# Patient Record
Sex: Female | Born: 1985 | Race: Black or African American | Hispanic: No | Marital: Single | Smoking: Current every day smoker
Health system: Southern US, Community
[De-identification: ages and names within clinical notes are randomized; demographics above are authoritative.]

## PROBLEM LIST (undated history)

## (undated) ENCOUNTER — Inpatient Hospital Stay (HOSPITAL_COMMUNITY): Payer: Self-pay

## (undated) DIAGNOSIS — F32A Depression, unspecified: Secondary | ICD-10-CM

## (undated) DIAGNOSIS — F329 Major depressive disorder, single episode, unspecified: Secondary | ICD-10-CM

## (undated) HISTORY — PX: TONSILLECTOMY: SUR1361

---
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
## 2008-01-17 ENCOUNTER — Emergency Department (HOSPITAL_COMMUNITY): Admission: EM | Admit: 2008-01-17 | Discharge: 2008-01-17 | Payer: Self-pay | Admitting: Emergency Medicine

## 2008-04-18 ENCOUNTER — Emergency Department (HOSPITAL_COMMUNITY): Admission: EM | Admit: 2008-04-18 | Discharge: 2008-04-18 | Payer: Self-pay | Admitting: Emergency Medicine

## 2010-02-16 ENCOUNTER — Inpatient Hospital Stay (HOSPITAL_COMMUNITY): Admission: AD | Admit: 2010-02-16 | Discharge: 2010-02-19 | Payer: Self-pay | Admitting: Obstetrics & Gynecology

## 2010-02-16 ENCOUNTER — Encounter (INDEPENDENT_AMBULATORY_CARE_PROVIDER_SITE_OTHER): Payer: Self-pay | Admitting: Obstetrics and Gynecology

## 2010-09-13 LAB — CBC
HCT: 33.6 % — ABNORMAL LOW (ref 36.0–46.0)
Hemoglobin: 11.2 g/dL — ABNORMAL LOW (ref 12.0–15.0)
Hemoglobin: 9 g/dL — ABNORMAL LOW (ref 12.0–15.0)
MCH: 28.8 pg (ref 26.0–34.0)
MCHC: 33.3 g/dL (ref 30.0–36.0)
MCV: 86.4 fL (ref 78.0–100.0)
Platelets: 178 10*3/uL (ref 150–400)
Platelets: 215 10*3/uL (ref 150–400)
RBC: 3.04 MIL/uL — ABNORMAL LOW (ref 3.87–5.11)
RBC: 3.89 MIL/uL (ref 3.87–5.11)
RDW: 16 % — ABNORMAL HIGH (ref 11.5–15.5)
WBC: 11 10*3/uL — ABNORMAL HIGH (ref 4.0–10.5)
WBC: 5.3 10*3/uL (ref 4.0–10.5)

## 2010-09-13 LAB — RPR: RPR Ser Ql: NONREACTIVE

## 2010-11-15 NOTE — H&P (Signed)
NAMEKENLYN, LOSE                ACCOUNT NO.:  [id number]   MEDICAL RECORD NO.:  [id number]          PATIENT TYPE:  INP   LOCATION:  0102                         FACILITY:  [facility]   PHYSICIAN:  Corinna L. Lendell Caprice, MDDATE OF BIRTH:  1985-10-10   DATE OF ADMISSION:  04/05/2005  DATE OF DISCHARGE:                                HISTORY & PHYSICAL   CHIEF COMPLAINT:  Tylenol overdose.   HISTORY OF PRESENT ILLNESS:  Ms. Yepiz is an 25 year old black female  patient of Dr. Theresia Lo who was brought to the emergency room apparently by  her mother after she admitted to taking 15 Extra Strength Tylenol at  approximately 3 a.m. She reports that she was trying to kill herself and  that she still feels suicidal. She has been feeling depressed for quite some  time. There is no particular event that has occurred to lead to this  attempt. However, she has been very stressed out, working two jobs, recently  started school at [employer]. She has never attempted suicide nor had a  psychiatrist, therapist, or psychiatric admission. She has never been  treated for depression in the past.  She has no pain. No nausea or vomiting  currently.   PAST MEDICAL HISTORY:  None.   PAST SURGICAL HISTORY:  None.   ALLERGIES:  None.   SOCIAL HISTORY:  She denies drinking, smoking, or drugs.   FAMILY HISTORY:  Negative for depression.   REVIEW OF SYSTEMS:  As above. Otherwise negative.   Her CBC is significant for a hemoglobin of 11.8, hematocrit 36.3, MCV 79.5;  otherwise unremarkable. PT and PTT normal. Complete metabolic panel normal.  Urine pregnancy negative. Salicylate less than 4. Acetaminophen level 83.5.  This was drawn at 10 a.m. Blood alcohol less than 5.  Urine drug screen  negative.   ASSESSMENT/PLAN:  Status post suicide attempt with Tylenol overdose. Poison  Control was contacted by the emergency department staff. They recommended  Mucomyst for at least 24 hours. However her repeat  laboratories show no  significant change. This can be stopped. I will admit her to telemetry on  suicide precautions. IV  acetylcysteine has been started per pharmacy protocol. I will continue this  and repeat her liver function tests and coagulation panel tomorrow. She will  be on 1 to 1 suicide precautions. I have called a consult over to behavioral  health. I anticipate that she will most likely be medically ready for  discharge tomorrow after 24 hours of Mucomyst.      Corinna L. Lendell Caprice, MD  Electronically Signed     CLS/MEDQ  D:  04/05/2005  T:  04/05/2005  Job:  [id number]   cc:   Vikki Ports, M.D.  Fax: [phone]

## 2011-04-06 ENCOUNTER — Encounter (HOSPITAL_COMMUNITY): Payer: Self-pay

## 2011-04-06 ENCOUNTER — Inpatient Hospital Stay (HOSPITAL_COMMUNITY)
Admission: AD | Admit: 2011-04-06 | Discharge: 2011-04-06 | Disposition: A | Discharge status: Home / Self Care | Payer: Medicaid Other | Source: Ambulatory Visit | Attending: Obstetrics and Gynecology | Admitting: Obstetrics and Gynecology

## 2011-04-06 ENCOUNTER — Inpatient Hospital Stay (HOSPITAL_COMMUNITY): Payer: Medicaid Other

## 2011-04-06 DIAGNOSIS — R1011 Right upper quadrant pain: Secondary | ICD-10-CM

## 2011-04-06 DIAGNOSIS — Z3480 Encounter for supervision of other normal pregnancy, unspecified trimester: Secondary | ICD-10-CM

## 2011-04-06 DIAGNOSIS — N949 Unspecified condition associated with female genital organs and menstrual cycle: Secondary | ICD-10-CM

## 2011-04-06 DIAGNOSIS — O99891 Other specified diseases and conditions complicating pregnancy: Secondary | ICD-10-CM | POA: Insufficient documentation

## 2011-04-06 DIAGNOSIS — R109 Unspecified abdominal pain: Secondary | ICD-10-CM | POA: Insufficient documentation

## 2011-04-06 DIAGNOSIS — O26859 Spotting complicating pregnancy, unspecified trimester: Secondary | ICD-10-CM | POA: Insufficient documentation

## 2011-04-06 LAB — DIFFERENTIAL
Eosinophils Relative: 3 % (ref 0–5)
Lymphocytes Relative: 27 % (ref 12–46)
Lymphs Abs: 1.5 10*3/uL (ref 0.7–4.0)
Monocytes Relative: 8 % (ref 3–12)
Neutrophils Relative %: 63 % (ref 43–77)

## 2011-04-06 LAB — CBC
Hemoglobin: 10.1 g/dL — ABNORMAL LOW (ref 12.0–15.0)
MCV: 84.4 fL (ref 78.0–100.0)
Platelets: 232 10*3/uL (ref 150–400)
RBC: 3.66 MIL/uL — ABNORMAL LOW (ref 3.87–5.11)
WBC: 5.7 10*3/uL (ref 4.0–10.5)

## 2011-04-06 LAB — HEPATITIS B SURFACE ANTIGEN: Hepatitis B Surface Ag: NEGATIVE

## 2011-04-06 LAB — URINALYSIS, ROUTINE W REFLEX MICROSCOPIC
Ketones, ur: NEGATIVE mg/dL
Leukocytes, UA: NEGATIVE
Nitrite: NEGATIVE
Specific Gravity, Urine: 1.025 (ref 1.005–1.030)
pH: 6.5 (ref 5.0–8.0)

## 2011-04-06 LAB — ABO/RH: ABO/RH(D): A POS

## 2011-04-06 LAB — RAPID URINE DRUG SCREEN, HOSP PERFORMED
Benzodiazepines: NOT DETECTED
Cocaine: NOT DETECTED
Opiates: NOT DETECTED

## 2011-04-06 LAB — HIV ANTIBODY (ROUTINE TESTING W REFLEX): HIV: NONREACTIVE

## 2011-04-06 NOTE — ED Provider Notes (Signed)
History   pt in with c/o r sided upper abd pain that is intermittent and sharp in nature that has been going on for several days.   Chief Complaint  Patient presents with  . Vaginal Bleeding  . Abdominal Cramping   HPI  OB History    Grav Para Term Preterm Abortions TAB SAB Ect Mult Living   2 1 1  0 0 0 0 0 0 1      Past Medical History  Diagnosis Date  . No pertinent past medical history     Past Surgical History  Procedure Date  . No past surgeries     No family history on file.  History  Substance Use Topics  . Smoking status: Former Smoker    Quit date: 12/29/2010  . Smokeless tobacco: Not on file  . Alcohol Use: No    Allergies: Allergies not on file  No prescriptions prior to admission    Review of Systems  Constitutional: Negative.   Eyes: Negative.   Respiratory: Negative.   Cardiovascular: Negative.   Gastrointestinal: Positive for abdominal pain.  Genitourinary: Negative.   Musculoskeletal: Negative.   Skin: Negative.   Neurological: Negative.   Endo/Heme/Allergies: Negative.   Psychiatric/Behavioral: Negative.    Physical Exam   Blood pressure 120/70, pulse 87, temperature 98.9 F (37.2 C), temperature source Oral, resp. rate 18, height 5\' 4"  (1.626 m), weight 81.103 kg (178 lb 12.8 oz).  Physical Exam  Constitutional: She is oriented to person, place, and time. She appears well-developed and well-nourished.  HENT:  Head: Normocephalic.  Neck: Normal range of motion.  Cardiovascular: Normal rate and regular rhythm.   Respiratory: Effort normal and breath sounds normal.  GI: Soft. Bowel sounds are normal.  Genitourinary: Vagina normal and uterus normal.  Musculoskeletal: Normal range of motion.  Neurological: She is alert and oriented to person, place, and time. She has normal reflexes.  Skin: Skin is warm and dry.  Psychiatric: She has a normal mood and affect. Her behavior is normal. Judgment and thought content normal.    MAU  Course  Procedures  MDM   Assessment and Plan  pn labs, [place] for dates and cervical lenghth. SVE cx cl/th/post high no bleeding noted on glove after exam. Stable maternal fetal unit.  Zerita Boers 04/06/2011, 10:49 AM

## 2011-04-06 NOTE — Progress Notes (Signed)
Onset of spotting since around 2:00 a.m. Stabbing type pains on sides, no spotting at present, no intercourse recently.

## 2011-04-24 LAB — RUBELLA ANTIBODY, IGM: Rubella: IMMUNE

## 2011-04-24 LAB — HIV ANTIBODY (ROUTINE TESTING W REFLEX): HIV: NONREACTIVE

## 2011-07-01 NOTE — L&D Delivery Note (Signed)
Delivery Note At 2:47 PM a viable female was delivered via VBAC, Spontaneous (Presentation: ; L Occiput Anterior). Easy delivery of the head, tight nuchal cord x 1 clamped and cut, easy delivery of the shoulders. Baby taken to RN at warmer. APGAR: 8, 9; weight .   Placenta status: Intact, Spontaneous.  Cord: 3 vessels. Anesthesia: Epidural  Episiotomy: None Lacerations: None Suture Repair: none Est. Blood Loss (mL): 300  Mom to postpartum.  Baby to rooming in.  Sherri Williams 08/15/2011, 3:05 PM

## 2011-07-25 LAB — GC/CHLAMYDIA PROBE AMP, GENITAL
Chlamydia: NEGATIVE
Gonorrhea: NEGATIVE

## 2011-08-15 ENCOUNTER — Encounter (HOSPITAL_COMMUNITY): Payer: Self-pay | Admitting: Anesthesiology

## 2011-08-15 ENCOUNTER — Inpatient Hospital Stay (HOSPITAL_COMMUNITY): Payer: Medicaid Other | Admitting: Anesthesiology

## 2011-08-15 ENCOUNTER — Inpatient Hospital Stay (HOSPITAL_COMMUNITY)
Admission: AD | Admit: 2011-08-15 | Discharge: 2011-08-17 | DRG: 775 | Disposition: A | Discharge status: Home / Self Care | Payer: Medicaid Other | Source: Ambulatory Visit | Attending: Obstetrics and Gynecology | Admitting: Obstetrics and Gynecology

## 2011-08-15 ENCOUNTER — Encounter (HOSPITAL_COMMUNITY): Payer: Self-pay

## 2011-08-15 DIAGNOSIS — IMO0001 Reserved for inherently not codable concepts without codable children: Secondary | ICD-10-CM

## 2011-08-15 DIAGNOSIS — O165 Unspecified maternal hypertension, complicating the puerperium: Secondary | ICD-10-CM | POA: Diagnosis not present

## 2011-08-15 DIAGNOSIS — D649 Anemia, unspecified: Secondary | ICD-10-CM | POA: Diagnosis not present

## 2011-08-15 DIAGNOSIS — Z98891 History of uterine scar from previous surgery: Secondary | ICD-10-CM

## 2011-08-15 DIAGNOSIS — R7989 Other specified abnormal findings of blood chemistry: Secondary | ICD-10-CM | POA: Clinically undetermined

## 2011-08-15 DIAGNOSIS — O34219 Maternal care for unspecified type scar from previous cesarean delivery: Secondary | ICD-10-CM | POA: Diagnosis present

## 2011-08-15 DIAGNOSIS — O9903 Anemia complicating the puerperium: Secondary | ICD-10-CM | POA: Diagnosis not present

## 2011-08-15 DIAGNOSIS — F329 Major depressive disorder, single episode, unspecified: Secondary | ICD-10-CM | POA: Diagnosis not present

## 2011-08-15 HISTORY — DX: Other specified abnormal findings of blood chemistry: R79.89

## 2011-08-15 HISTORY — DX: Unspecified maternal hypertension, complicating the puerperium: O16.5

## 2011-08-15 HISTORY — DX: Major depressive disorder, single episode, unspecified: F32.9

## 2011-08-15 HISTORY — DX: Depression, unspecified: F32.A

## 2011-08-15 LAB — CBC
HCT: 31.9 % — ABNORMAL LOW (ref 36.0–46.0)
MCHC: 32.3 g/dL (ref 30.0–36.0)
MCHC: 32.4 g/dL (ref 30.0–36.0)
MCV: 82 fL (ref 78.0–100.0)
Platelets: 169 10*3/uL (ref 150–400)
RDW: 15.8 % — ABNORMAL HIGH (ref 11.5–15.5)
RDW: 15.7 % — ABNORMAL HIGH (ref 11.5–15.5)
WBC: 11.4 10*3/uL — ABNORMAL HIGH (ref 4.0–10.5)

## 2011-08-15 MED ORDER — PRENATAL MULTIVITAMIN CH
1.0000 | ORAL_TABLET | Freq: Every day | ORAL | Status: DC
  Administered 2011-08-15 – 2011-08-17 (×3): 1 via ORAL
  Filled 2011-08-15 (×3): qty 1

## 2011-08-15 MED ORDER — OXYCODONE-ACETAMINOPHEN 5-325 MG PO TABS: 1.0000 | ORAL_TABLET | ORAL | Status: DC | PRN

## 2011-08-15 MED ORDER — OXYTOCIN 20 UNITS IN LACTATED RINGERS INFUSION - SIMPLE: 125.0000 mL/h | Freq: Once | INTRAVENOUS | Status: DC

## 2011-08-15 MED ORDER — ZOLPIDEM TARTRATE 10 MG PO TABS: 10.0000 mg | ORAL_TABLET | Freq: Every evening | ORAL | Status: DC | PRN

## 2011-08-15 MED ORDER — PHENYLEPHRINE 40 MCG/ML (10ML) SYRINGE FOR IV PUSH (FOR BLOOD PRESSURE SUPPORT)
80.0000 ug | PREFILLED_SYRINGE | INTRAVENOUS | Status: DC | PRN
  Filled 2011-08-15: qty 5

## 2011-08-15 MED ORDER — SENNOSIDES-DOCUSATE SODIUM 8.6-50 MG PO TABS
2.0000 | ORAL_TABLET | Freq: Every day | ORAL | Status: DC
  Administered 2011-08-15 – 2011-08-16 (×2): 2 via ORAL

## 2011-08-15 MED ORDER — ZOLPIDEM TARTRATE 5 MG PO TABS: 5.0000 mg | ORAL_TABLET | Freq: Every evening | ORAL | Status: DC | PRN

## 2011-08-15 MED ORDER — FLEET ENEMA 7-19 GM/118ML RE ENEM: 1.0000 | ENEMA | RECTAL | Status: DC | PRN

## 2011-08-15 MED ORDER — LACTATED RINGERS IV SOLN
500.0000 mL | Freq: Once | INTRAVENOUS | Status: AC
  Administered 2011-08-15: 500 mL via INTRAVENOUS

## 2011-08-15 MED ORDER — WITCH HAZEL-GLYCERIN EX PADS: 1.0000 "application " | MEDICATED_PAD | CUTANEOUS | Status: DC | PRN

## 2011-08-15 MED ORDER — ACETAMINOPHEN 325 MG PO TABS: 650.0000 mg | ORAL_TABLET | ORAL | Status: DC | PRN

## 2011-08-15 MED ORDER — TETANUS-DIPHTH-ACELL PERTUSSIS 5-2.5-18.5 LF-MCG/0.5 IM SUSP
0.5000 mL | Freq: Once | INTRAMUSCULAR | Status: AC
  Administered 2011-08-16: 0.5 mL via INTRAMUSCULAR
  Filled 2011-08-15: qty 0.5

## 2011-08-15 MED ORDER — DIBUCAINE 1 % RE OINT: 1.0000 "application " | TOPICAL_OINTMENT | RECTAL | Status: DC | PRN

## 2011-08-15 MED ORDER — EPHEDRINE 5 MG/ML INJ
10.0000 mg | INTRAVENOUS | Status: DC | PRN
  Filled 2011-08-15: qty 4

## 2011-08-15 MED ORDER — DIPHENHYDRAMINE HCL 25 MG PO CAPS: 25.0000 mg | ORAL_CAPSULE | Freq: Four times a day (QID) | ORAL | Status: DC | PRN

## 2011-08-15 MED ORDER — DIPHENHYDRAMINE HCL 50 MG/ML IJ SOLN: 12.5000 mg | INTRAMUSCULAR | Status: DC | PRN

## 2011-08-15 MED ORDER — IBUPROFEN 600 MG PO TABS
600.0000 mg | ORAL_TABLET | Freq: Four times a day (QID) | ORAL | Status: DC
  Administered 2011-08-15 – 2011-08-17 (×8): 600 mg via ORAL
  Filled 2011-08-15 (×8): qty 1

## 2011-08-15 MED ORDER — LACTATED RINGERS IV SOLN
INTRAVENOUS | Status: DC
  Administered 2011-08-15: 11:00:00 via INTRAUTERINE

## 2011-08-15 MED ORDER — LANOLIN HYDROUS EX OINT: TOPICAL_OINTMENT | CUTANEOUS | Status: DC | PRN

## 2011-08-15 MED ORDER — SIMETHICONE 80 MG PO CHEW: 80.0000 mg | CHEWABLE_TABLET | ORAL | Status: DC | PRN

## 2011-08-15 MED ORDER — LACTATED RINGERS IV SOLN
INTRAVENOUS | Status: DC
  Administered 2011-08-15: 125 mL/h via INTRAVENOUS
  Administered 2011-08-15: 14:00:00 via INTRAVENOUS
  Administered 2011-08-15: 125 mL/h via INTRAVENOUS

## 2011-08-15 MED ORDER — ONDANSETRON HCL 4 MG/2ML IJ SOLN: 4.0000 mg | INTRAMUSCULAR | Status: DC | PRN

## 2011-08-15 MED ORDER — ONDANSETRON HCL 4 MG PO TABS: 4.0000 mg | ORAL_TABLET | ORAL | Status: DC | PRN

## 2011-08-15 MED ORDER — ONDANSETRON HCL 4 MG/2ML IJ SOLN: 4.0000 mg | Freq: Four times a day (QID) | INTRAMUSCULAR | Status: DC | PRN

## 2011-08-15 MED ORDER — FENTANYL 2.5 MCG/ML BUPIVACAINE 1/10 % EPIDURAL INFUSION (WH - ANES)
14.0000 mL/h | INTRAMUSCULAR | Status: DC
  Administered 2011-08-15 (×3): 14 mL/h via EPIDURAL
  Filled 2011-08-15 (×3): qty 60

## 2011-08-15 MED ORDER — OXYTOCIN 20 UNITS IN LACTATED RINGERS INFUSION - SIMPLE
1.0000 m[IU]/min | INTRAVENOUS | Status: DC
  Administered 2011-08-15: 5 m[IU]/min via INTRAVENOUS
  Administered 2011-08-15: 333 m[IU]/min via INTRAVENOUS
  Administered 2011-08-15 (×2): 1 m[IU]/min via INTRAVENOUS
  Filled 2011-08-15: qty 1000

## 2011-08-15 MED ORDER — LACTATED RINGERS IV SOLN: 500.0000 mL | INTRAVENOUS | Status: DC | PRN

## 2011-08-15 MED ORDER — IBUPROFEN 600 MG PO TABS: 600.0000 mg | ORAL_TABLET | Freq: Four times a day (QID) | ORAL | Status: DC | PRN

## 2011-08-15 MED ORDER — BUTORPHANOL TARTRATE 2 MG/ML IJ SOLN: 1.0000 mg | INTRAMUSCULAR | Status: DC | PRN

## 2011-08-15 MED ORDER — OXYCODONE-ACETAMINOPHEN 5-325 MG PO TABS
1.0000 | ORAL_TABLET | ORAL | Status: DC | PRN
  Administered 2011-08-16 (×2): 1 via ORAL
  Filled 2011-08-15 (×2): qty 1

## 2011-08-15 MED ORDER — EPHEDRINE 5 MG/ML INJ: 10.0000 mg | INTRAVENOUS | Status: DC | PRN

## 2011-08-15 MED ORDER — PHENYLEPHRINE 40 MCG/ML (10ML) SYRINGE FOR IV PUSH (FOR BLOOD PRESSURE SUPPORT): 80.0000 ug | PREFILLED_SYRINGE | INTRAVENOUS | Status: DC | PRN

## 2011-08-15 MED ORDER — LIDOCAINE HCL (PF) 1 % IJ SOLN
INTRAMUSCULAR | Status: DC | PRN
  Administered 2011-08-15 (×3): 4 mL

## 2011-08-15 MED ORDER — TERBUTALINE SULFATE 1 MG/ML IJ SOLN: 0.2500 mg | Freq: Once | INTRAMUSCULAR | Status: DC | PRN

## 2011-08-15 MED ORDER — CITRIC ACID-SODIUM CITRATE 334-500 MG/5ML PO SOLN: 30.0000 mL | ORAL | Status: DC | PRN

## 2011-08-15 MED ORDER — BENZOCAINE-MENTHOL 20-0.5 % EX AERO: 1.0000 "application " | INHALATION_SPRAY | CUTANEOUS | Status: DC | PRN

## 2011-08-15 MED ORDER — OXYTOCIN BOLUS FROM INFUSION
500.0000 mL | Freq: Once | INTRAVENOUS | Status: DC
  Filled 2011-08-15: qty 500

## 2011-08-15 MED ORDER — LIDOCAINE HCL (PF) 1 % IJ SOLN: 30.0000 mL | INTRAMUSCULAR | Status: DC | PRN

## 2011-08-15 NOTE — H&P (Signed)
History    26 yo G2P1001 at 42 6/7 weeks presented unannounced c/o contractions since yesterday, increased in frequency tonight.  Denies leaking or bleeding, reports +FM.  Cervix was 3 cm in office this week.  She was evaluated in MAU, with initial cervical exam still 3 cm.  She ambulated for 1 hour, then advanced her cervix to 5 cm, 100%.    Pregnancy remarkable for: Previous C/S in 2011 for non-reassuring FHR, with patient advancing to 9 cm Allergy to Amoxicillin--hives Hx depression 2010--stopped meds 3/12 Late to care at 22 weeks EIF in LV Desires VBAC  History of present pregnancy: Patient entered care at 22 weeks.  Had been seen at MAU prior to that time , with [place] on 04/06/11 for anatomy.  LV EIF noted at that time.  EDC of 08/23/11 was established by LMP and in agreement with the October [place].  Further ultrasounds were done at  32 weeks, with normal growth and fluid.  Her prenatal course was essentially uncomplicated.  She desired VBAC, and consent was signed in the office. At her last evalution, she was 3 cm.  OB History    Grav Para Term Preterm Abortions TAB SAB Ect Mult Living   2 1 1  0 0 0 0 0 0 1    #1--2011 Primary C/S, 2 layer closure, for NRFHR.  Female 7+3, 40 weeks, 14 hours of labor to 9 cm (GVOBGYN) #2--Current, new partner   Past Medical History  Diagnosis Date  . No pertinent past medical history   Hx anemia.  Hx depression since 2010, stopped meds 3/12.  Previous smoker till 2010.    Past Surgical History  Procedure Date  . No past surgeries   LTCS 2011.  Tonsils age 44  Family HX:  MGM hypertension.  MGM diabetes.  MGF prostate cancer.  MAs smokers.  FOB's father with congenital heart issues.  Patient's mother and brother [place] trait.  History  Substance Use Topics  . Smoking status: Former Smoker    Quit date: 12/29/2010  . Smokeless tobacco: Not on file  . Alcohol Use: No    Allergies:  Allergies  Allergen Reactions  . Amoxicillin Rash   Social  history:  Single, FOB involved and supportive [name]), patient is of the [place] faith.  She is high-school educated, currently unemployed.  FOB has high-school education, also unemployed.    Prescriptions prior to admission  . Order #: [id number] Class: Historical Med   Prenatal labs: ABO, Rh: --/--/A POS (10/07 1046) Antibody:  Negative Rubella:  Immune RPR: NON REACTIVE (10/07 1046)  HBsAg: NEGATIVE (10/07 1046)  HIV: NON REACTIVE (10/07 1046)  GBS:   Negative Cultures negative from MAU and at 36 weeks Glucola WNL Hgb WNL  Sickle cell negative  Physical Exam: Chest clear Heart RRR without murmur Abd gravid, NT Pelvic--Now 5 cm, 100%, vtx -1, slight BBOW Ext WNL  FHR reactive, single mild variable with one contraction, otherwise reactive, positive bloody show. UCs q 5 min, mild/moderate.   Assessment: IUP at 38 6/7 weeks Active labor GBS negative Previous C/S, with desire for VBAC (consent signed in office) Category 1 FHR Will desire epidural  Plan: Admit to Birthing Suite per consult with Dr. Su Hilt Routine CNM orders Plan epidural as labor advances Reviewed R&B of VBAC with patient, including failure of trial, need for repeat C/S, non-reassuring FHR, maternal/fetal compromise.  Patient seems to understand these risks and wishes to proceed with VBAC.  Nigel Bridgeman, CNM, MN 08/15/11  3:20am

## 2011-08-15 NOTE — Progress Notes (Signed)
Comfortable slight pressure on bottom O VSS     Fhts category 1 accels     uc 140-170 MVU     Vag C +2 A 2nd stage     VBAC P labor down until feeling more sensation due to hx  Of variables now resolved maybe low fetal tolerance for Pushing. Lavera Guise, CNM

## 2011-08-15 NOTE — Progress Notes (Signed)
Comfortable with epidural, agrees to amnioinfusion O fhts 130-140s LTV min to mod severe variables     UC 25-60 mmHg q 2-3 adequate     abd soft, gravid, nt between uc     Vag C +1     VSS MP 80 A 2nd stage VBAC    Severe variables P Pitocin off repositioned, O2 10 L amnioinfusion, collaboration With Dr. Pennie Rushing in OR. Lavera Guise, CNM

## 2011-08-15 NOTE — Progress Notes (Signed)
  Subjective: Comfortable with epidural.  Sleeping at intervals.  UCs irregular.  Objective: BP 95/43  Pulse 73  Temp(Src) 98.1 F (36.7 C) (Oral)  Resp 18  Ht 5\' 5"  (1.651 m)  Wt 92.534 kg (204 lb)  BMI 33.95 kg/m2  SpO2 82%     FHT:  Category 1 UC:   Irregular, MVUs <100 SVE:  6, 90%, vtx, -1/0  Assessment / Plan: Inadequate labor  Consulted with Dr. Su Hilt.  Will start low-dose pitocin. Reviewed R&B of pitocin and VBAC with patient--she seems to understand and wishes to proceed.    Nigel Bridgeman 08/15/2011, 6:39 AM

## 2011-08-15 NOTE — Anesthesia Procedure Notes (Signed)
Epidural Patient location during procedure: OB Start time: 08/15/2011 4:02 AM Reason for block: procedure for pain  Staffing Performed by: anesthesiologist   Preanesthetic Checklist Completed: patient identified, site marked, surgical consent, pre-op evaluation, timeout performed, IV checked, risks and benefits discussed and monitors and equipment checked  Epidural Patient position: sitting Prep: site prepped and draped and DuraPrep Patient monitoring: continuous pulse ox and blood pressure Approach: midline Injection technique: LOR air  Needle:  Needle type: Tuohy  Needle gauge: 17 G Needle length: 9 cm Needle insertion depth: 7 cm Catheter type: closed end flexible Catheter size: 19 Gauge Catheter at skin depth: 12 cm Test dose: negative  Assessment Events: blood not aspirated, injection not painful, no injection resistance, negative IV test and no paresthesia  Additional Notes Discussed risk of headache, infection, bleeding, nerve injury and failed or incomplete block.  Patient voices understanding and wishes to proceed.

## 2011-08-15 NOTE — Anesthesia Preprocedure Evaluation (Signed)
Anesthesia Evaluation  Patient identified by MRN, date of birth, ID band Patient awake    Reviewed: Allergy & Precautions, H&P , NPO status , Patient's Chart, lab work & pertinent test results, reviewed documented beta blocker date and time   History of Anesthesia Complications Negative for: history of anesthetic complications  Airway Mallampati: II TM Distance: >3 FB Neck ROM: full    Dental  (+) Teeth Intact   Pulmonary former smoker clear to auscultation        Cardiovascular neg cardio ROS regular Normal    Neuro/Psych PSYCHIATRIC DISORDERS (depression) Negative Neurological ROS     GI/Hepatic negative GI ROS, Neg liver ROS,   Endo/Other  Negative Endocrine ROS  Renal/GU negative Renal ROS  Genitourinary negative   Musculoskeletal   Abdominal   Peds  Hematology negative hematology ROS (+)   Anesthesia Other Findings   Reproductive/Obstetrics (+) Pregnancy (h/o c/s x1 for fetal distress)                           Anesthesia Physical Anesthesia Plan  ASA: II  Anesthesia Plan: Epidural   Post-op Pain Management:    Induction:   Airway Management Planned:   Additional Equipment:   Intra-op Plan:   Post-operative Plan:   Informed Consent: I have reviewed the patients History and Physical, chart, labs and discussed the procedure including the risks, benefits and alternatives for the proposed anesthesia with the patient or authorized representative who has indicated his/her understanding and acceptance.     Plan Discussed with:   Anesthesia Plan Comments:         Anesthesia Quick Evaluation

## 2011-08-15 NOTE — Progress Notes (Signed)
Asleep, not disturbed O VSS     Fhts 125 LTV mod     uc q 3-4     Vag not assessed A 38 6/7 week IUP     Active labor P has epidural, pitocin at 4, continue care Lavera Guise, CNM

## 2011-08-15 NOTE — Anesthesia Postprocedure Evaluation (Signed)
  Anesthesia Post-op Note  Patient: Sherri Williams  Procedure(s) Performed: * No procedures listed *  Patient Location: PACU and Mother/Baby  Anesthesia Type: Epidural  Level of Consciousness: awake, alert , oriented and patient cooperative  Airway and Oxygen Therapy: Patient Spontanous Breathing  Post-op Pain: mild  Post-op Assessment: Post-op Vital signs reviewed  Post-op Vital Signs: Reviewed and stable  Complications: No apparent anesthesia complications

## 2011-08-15 NOTE — Progress Notes (Signed)
Pt states having uc's all day yesterday, became stronger tonight and more regular.  Denies any problems with her pregnancy.

## 2011-08-15 NOTE — Progress Notes (Signed)
Comfortable states some pressure O VSS      Fhts category one      abd soft, gravid,nt      uc q 2- 3      C+3 A  38 6/7 week IUP VBAC      Pushing well, Dr. Pennie Rushing here and updated.      Lavera Guise, CNM

## 2011-08-15 NOTE — ED Provider Notes (Signed)
History    26 yo G2P1001 at 80 6/7 weeks presented unannounced c/o contractions since yesterday, increased in frequency tonight.  Denies leaking or bleeding, reports +FM.  Cervix was 3 cm in office this week.  Pregnancy remarkable for: Previous C/S in 2011 for non-reassuring FHR, with patient advancing to 9 cm Allergy to Amoxicillin--hives Hx depression 2010--stopped meds 3/12 Late to care at 22 weeks EIF in LV Desires VBAC  History of present pregnancy: Patient entered care at 22 weeks.  Had been seen at MAU prior to that time , with [place] on 04/06/11 for anatomy.  LV EIF noted at that time.  EDC of 08/23/11 was established by LMP and in agreement with the October [place].  Further ultrasounds were done at  32 weeks, with normal growth and fluid.  Her prenatal course was essentially uncomplicated.  She desired VBAC, and consent was signed in the office. At her last evalution, she was 3 cm.  OB History    Grav Para Term Preterm Abortions TAB SAB Ect Mult Living   2 1 1  0 0 0 0 0 0 1    #1--2011 Primary C/S, 2 layer closure, for NRFHR.  Female 7+3, 40 weeks, 14 hours of labor to 9 cm (GVOBGYN) #2--Current, new partner   Past Medical History  Diagnosis Date  . No pertinent past medical history   Hx anemia.  Hx depression since 2010, stopped meds 3/12.  Previous smoker till 2010.    Past Surgical History  Procedure Date  . No past surgeries   LTCS 2011.  Tonsils age 3  Family HX:  MGM hypertension.  MGM diabetes.  MGF prostate cancer.  MAs smokers.  FOB's father with congenital heart issues.  Patient's mother and brother [place] trait.  History  Substance Use Topics  . Smoking status: Former Smoker    Quit date: 12/29/2010  . Smokeless tobacco: Not on file  . Alcohol Use: No    Allergies:  Allergies  Allergen Reactions  . Amoxicillin Rash   Social history:  Single, FOB involved and supportive [name]), patient is of the [place] faith.  She is high-school educated, currently  unemployed.  FOB has high-school education, also unemployed.    Prescriptions prior to admission  . Order #: [id number] Class: Historical Med    Physical Exam   Blood pressure 111/59, pulse 85, temperature 99.2 F (37.3 C), temperature source Oral, height 5\' 5"  (1.651 m), weight 92.761 kg (204 lb 8 oz).  Chest clear Heart RRR without murmur Abdomen gravid, NT Pelvic--cervix 3 cm, 90%, vtx -1, small amount show after exam Ext WNL  FHR reactive, no decels UCs q 5 min, mild/moderate  ED Course  IUP at 38 6/7 weeks Early vs. Prodromal labor Previous C/S, desires VBAC  Plan: Ambulate x 1 hour, then re-evaluate.  Nigel Bridgeman, CNM, MN  08/15/11 1:15am

## 2011-08-15 NOTE — Progress Notes (Signed)
  Subjective: Comfortable with epidural--UCs had become more irregular just before the epidural was placed.  Objective: BP 108/55  Pulse 79  Temp(Src) 97.9 F (36.6 C) (Oral)  Resp 18  Ht 5\' 5"  (1.651 m)  Wt 92.534 kg (204 lb)  BMI 33.95 kg/m2  SpO2 82%      FHT:  Category 1 UC:   Irregular, q 2-5 min, mild SVE:   Dilation: 5 Effacement (%): 100 Station: -1 Exam by:: Emilee Hero, CNM AROM--clear fluid  Labs: Lab Results  Component Value Date   WBC 7.3 08/15/2011   HGB 10.3* 08/15/2011   HCT 31.9* 08/15/2011   MCV 82.0 08/15/2011   PLT 212 08/15/2011    Assessment / Plan: Spontaneous labor, progressing normally Previous C/S, desires VBAC Will continue to observe--augment prn.    Sherri Williams 08/15/2011, 5:07 AM

## 2011-08-15 NOTE — Progress Notes (Signed)
HURT BAD  ALL DAY- TONIGHT CLOSER AT 8PM     LAST WEEK- VE  3 CM.  HAS APPOINTMENT  AT 0845  DENIES HSV AND MRSA.   THIS DELIVERY- VBAC

## 2011-08-16 MED ORDER — POLYSACCHARIDE IRON 150 MG PO CAPS
150.0000 mg | ORAL_CAPSULE | Freq: Every day | ORAL | Status: DC
  Administered 2011-08-16 – 2011-08-17 (×2): 150 mg via ORAL
  Filled 2011-08-16 (×4): qty 1

## 2011-08-16 NOTE — Progress Notes (Signed)
PSYCHOSOCIAL ASSESSMENT ~ MATERNAL/CHILD Name:  Sherri Williams Age 25 day Referral Date 08/16/11 Reason/Source  History of depression  I. FAMILY/HOME ENVIRONMENT A. Child's Legal Guardian  Parent(s) Sherri Williams parent    DSS  Name  Sherri Williams DOB  1985/07/11 Age  [address] Address [address] [place], [place]  [address] Name  Sherri Williams (FOB) DOB Age  Address Same as above Other Household Members/Support Persons Name Sherri Williams Relationship  Sibling DOB  8 months old   II. PSYCHOSOCIAL DATA A. Information Source  Patient Interview X  Family Interview           Other B. Event organiser  Employment  MOB is unemployed  Medicaid  X   [employer]                                  Self Pay   Food Stamps X     WIC  X  Work Scientist, physiological Housing      Section 8     Maternity Care Coordination/Child Service Coordination/Early Intervention    School  Grade      Other Cultural and Environment Information Cultural Issues Impacting Care  III. STRENGTHS  Supportive family/friends Yes  Adequate Resources    Yes Compliance with medical plan  Yes Home prepared for Child (including basic supplies)  Yes Understanding of illness  Yes         Other  IV. RISK FACTORS AND CURRENT PROBLEMS    V. No Problems Note   VI.  Substance Abuse                                          Pt Family             Mental Illness     Pt Family               Family/Relationship Issues   Pt Family      Abuse/Neglect/Domestic Violence   Pt Family   Financial Resources     Pt Family  Transportation     Pt Family  DSS Involvement    Pt Family  Adjustment to Illness    Pt Family   Knowledge/Cognitive Deficit   Pt Family   Compliance with Treatment   Pt Family   Basic Needs (food, housing, etc)  Pt Family  Housing Concerns    Pt Family  Other             VII. SOCIAL WORK ASSESSMENT SW received consult due to history of depression.  Pt denied  currently feeling depressed and stated she took an antidepressant for 3-4 months due to stressful issues in her life.  She stated the issues have been resolved and she has not taken the medication in several months.  Pt did not disclose details of stressors.  SW provided pt with Feelings After Birth brochure.  She stated she will use [facility] for a pediatrician.  She acknowledged signs and symptoms of depression and will inform a support person in the future if needed.  Pt has needed supplies.  She also expressed having a good support system.  Encouraged her to contact SW as needed.  VIII. SOCIAL WORK PLAN (In [place]) No Further Intervention Required/No Barriers to Discharge Psychosocial Support and Ongoing Assessment of Needs Patient/Family  Education Child Protective Services Report  [place]        Date Information/Referral to [facility]   Other

## 2011-08-16 NOTE — Progress Notes (Signed)
Post Partum Day 1 Subjective: no complaints, up ad lib, voiding, tolerating PO, + flatus and VB w/ small clots still and like a period.  Formula-feeding.  Planning outpatient circumcision.  No dizziness when up. States previous Williams/o anemia.  Reports mood stable.  Objective: Blood pressure 116/72, pulse 70, temperature 98.2 F (36.8 C), temperature source Oral, resp. rate 18, height 5\' 5"  (1.651 m), weight 92.534 kg (204 lb), SpO2 82.00%, unknown if currently breastfeeding.  Physical Exam:  General: alert, cooperative and no distress Lochia: appropriate Uterine Fundus: firm, below umbilicus Incision: n/a--no lacerations during delivery DVT Evaluation: No evidence of DVT seen on physical exam. Negative Homan's sign. No significant calf/ankle edema.   Basename 08/15/11 1908 08/15/11 0320  HGB 9.6* 10.3*  HCT 29.6* 31.9*    Assessment/Plan: Plan for discharge tomorrow and Social Work consult secondary to Williams/o depression. Successful VBAC.  40month old daughter at home w/ grandparents.   Mild anemia w/ Williams/o.  Will start daily po Fe supplement.  LOS: 1 day   Sherri Williams 08/16/2011, 10:09 AM

## 2011-08-17 MED ORDER — IBUPROFEN 600 MG PO TABS: 600.0000 mg | ORAL_TABLET | Freq: Four times a day (QID) | ORAL | Status: AC | PRN

## 2011-08-17 MED ORDER — NIFEREX-150 FORTE 50-100 MG PO CAPS: 150.0000 mg | ORAL_CAPSULE | Freq: Every day | ORAL | Status: DC

## 2011-08-17 NOTE — Discharge Instructions (Signed)
Iron-Rich Diet An iron-rich diet contains foods that are good sources of iron. Iron is an important mineral that helps your body produce hemoglobin. Hemoglobin is a protein in red blood cells that carries oxygen to the body's tissues. Sometimes, the iron level in your blood can be low. This may be caused by:  A lack of iron in your diet.   Blood loss.   Times of growth, such as during pregnancy or during a child's growth and development.  Low levels of iron can cause a decrease in the number of red blood cells. This can result in iron deficiency anemia. Iron deficiency anemia symptoms include:  Tiredness.   Weakness.   Irritability.   Increased chance of infection.  Here are some recommendations for daily iron intake:  Males older than 25 years of age need 8 mg of iron per day.   Women ages 19 to 50 need 18 mg of iron per day.   Pregnant women need 27 mg of iron per day, and women who are over 19 years of age and breastfeeding need 9 mg of iron per day.   Women over the age of 50 need 8 mg of iron per day.  SOURCES OF IRON There are 2 types of iron that are found in food: heme iron and nonheme iron. Heme iron is absorbed by the body better than nonheme iron. Heme iron is found in meat, poultry, and fish. Nonheme iron is found in grains, beans, and vegetables. Heme Iron Sources Food / Iron (mg)  Chicken liver, 3 oz (85 g)/ 10 mg   Beef liver, 3 oz (85 g)/ 5.5 mg   Oysters, 3 oz (85 g)/ 8 mg   Beef, 3 oz (85 g)/ 2 to 3 mg   Shrimp, 3 oz (85 g)/ 2.8 mg   Turkey, 3 oz (85 g)/ 2 mg   Chicken, 3 oz (85 g) / 1 mg   Fish (tuna, halibut), 3 oz (85 g)/ 1 mg   Pork, 3 oz (85 g)/ 0.9 mg  Nonheme Iron Sources Food / Iron (mg)  Ready-to-eat breakfast cereal, iron-fortified / 3.9 to 7 mg   Tofu,  cup / 3.4 mg   Kidney beans,  cup / 2.6 mg   Baked potato with skin / 2.7 mg   Asparagus,  cup / 2.2 mg   Avocado / 2 mg   Dried peaches,  cup / 1.6 mg   Raisins,  cup  / 1.5 mg   Soy milk, 1 cup / 1.5 mg   Whole-wheat bread, 1 slice / 1.2 mg   Spinach, 1 cup / 0.8 mg   Broccoli,  cup / 0.6 mg  IRON ABSORPTION Certain foods can decrease the body's absorption of iron. Try to avoid these foods and beverages while eating meals with iron-containing foods:  Coffee.   Tea.   Fiber.   Soy.  Foods containing vitamin C can help increase the amount of iron your body absorbs from iron sources, especially from nonheme sources. Eat foods with vitamin C along with iron-containing foods to increase your iron absorption. Foods that are high in vitamin C include many fruits and vegetables. Some good sources are:  Fresh orange juice.   Oranges.   Strawberries.   Mangoes.   Grapefruit.   Red bell peppers.   Green bell peppers.   Broccoli.   Potatoes with skin.   Tomato juice.  Document Released: 01/28/2005 Document Revised: 02/26/2011 Document Reviewed: 12/05/2010 Exit[employer] Patient Information 2012 [employer]. 

## 2011-08-17 NOTE — Discharge Summary (Signed)
   Obstetric Discharge Summary Reason for Admission: onset of labor Prenatal Procedures: ultrasound Intrapartum Procedures: VBAC, epidural Postpartum Procedures: none Complications-Operative and Postpartum: none  Temp:  [98.2 F (36.8 C)-98.5 F (36.9 C)] 98.2 F (36.8 C) (02/17 0548) Pulse Rate:  [76-79] 76  (02/17 0548) Resp:  [18] 18  (02/17 0548) BP: (117-131)/(71-82) 117/71 mmHg (02/17 0548) Hemoglobin  Date Value Range Status  08/15/2011 9.6* 12.0-15.0 (g/dL) Final     HCT  Date Value Range Status  08/15/2011 29.6* 36.0-46.0 (%) Final    Hospital Course:  Hospital Course: Admitted in labor at 5cm. neg GBS. Received epidural, AROM, pitocin augmentation.  Progressed to fully dilated, received an amnioinfusion secondary to FHR variables. Delivery was performed by Maryln Manuel without difficulty. Patient and baby tolerated the procedure without difficulty, with no laceration noted. Infant to FTN. Mother and infant then had an uncomplicated postpartum course, with bottle feeding going well. Mom's physical exam was WNL, and she was discharged home in stable condition. Contraception plan was mirena.  She received adequate benefit from po pain medications.  Discharge Diagnoses: successful VBAC, anemia  Discharge Information: Date: 08/17/2011 Activity: pelvic rest Diet: routine, iron-rich Medications:  Medication List  As of 08/17/2011  5:32 PM   START taking these medications         ibuprofen 600 MG tablet   Commonly known as: ADVIL,MOTRIN   Take 1 tablet (600 mg total) by mouth every 6 (six) hours as needed for pain.      Niferex-150 Forte 50-100 MG Caps   Take 150 mg by mouth daily.         CONTINUE taking these medications         prenatal multivitamin Tabs          Where to get your medications    These are the prescriptions that you need to pick up.   You may get these medications from any pharmacy.         ibuprofen 600 MG tablet   Niferex-150 Forte 50-100  MG Caps           Condition: stable Instructions: refer to practice specific booklet Discharge to: home Follow-up Information    Follow up with [facility] in 5 weeks. (If symptoms worsen as needed)          Newborn Data: Live born  Information for the patient's newborn:  Atlee, Villers [[id number]]  female ; APGAR , 8, 9 ; weight ; 6#15oz Home with mother.  Sherri Williams M 08/17/2011, 5:32 PM

## 2011-08-18 NOTE — Progress Notes (Signed)
Post discharge chart review completed.  

## 2011-09-18 ENCOUNTER — Ambulatory Visit: Payer: Self-pay | Admitting: Obstetrics and Gynecology

## 2011-09-22 ENCOUNTER — Ambulatory Visit (INDEPENDENT_AMBULATORY_CARE_PROVIDER_SITE_OTHER): Payer: Medicaid Other | Admitting: Registered Nurse

## 2011-09-22 DIAGNOSIS — Z3041 Encounter for surveillance of contraceptive pills: Secondary | ICD-10-CM

## 2011-12-16 ENCOUNTER — Telehealth: Payer: Self-pay | Admitting: Obstetrics and Gynecology

## 2011-12-16 NOTE — Telephone Encounter (Signed)
Try calling pt rgd msg no answer unable to leave msg 

## 2011-12-17 ENCOUNTER — Other Ambulatory Visit: Payer: Self-pay | Admitting: Obstetrics and Gynecology

## 2011-12-17 MED ORDER — LEVONORGEST-ETH ESTRAD 91-DAY 0.15-0.03 MG PO TABS: 1.0000 | ORAL_TABLET | Freq: Every day | ORAL | Status: DC

## 2011-12-17 NOTE — Telephone Encounter (Signed)
Try calling pt rgd msg no answer unable to leave msg 

## 2011-12-17 NOTE — Telephone Encounter (Signed)
Niccole/talked w/pt

## 2011-12-17 NOTE — Telephone Encounter (Signed)
Spoke with pt rgd msg pt had to cancel appt for 12/24/11 due to work pt need refill on rx until appt in august no missed pills advised pt will refill bc until appt pt voice understanding per protocol rx sent to pharm

## 2011-12-18 NOTE — Telephone Encounter (Signed)
Spoke with pt rgd msg pt wants x sent to [facility] will fax form to office tomorrow for office to fill out form

## 2011-12-18 NOTE — Telephone Encounter (Signed)
Triage/cld yest. Same inquiry.

## 2011-12-22 ENCOUNTER — Telehealth: Payer: Self-pay | Admitting: Obstetrics and Gynecology

## 2011-12-22 NOTE — Telephone Encounter (Signed)
Triage/cht already in triage

## 2011-12-23 ENCOUNTER — Encounter: Payer: Medicaid Other | Admitting: Registered Nurse

## 2011-12-23 NOTE — Telephone Encounter (Signed)
Attempted pc to pt. No msg left.  ld

## 2011-12-24 ENCOUNTER — Encounter: Payer: Medicaid Other | Admitting: Obstetrics and Gynecology

## 2011-12-26 ENCOUNTER — Telehealth: Payer: Self-pay | Admitting: Obstetrics and Gynecology

## 2011-12-26 NOTE — Telephone Encounter (Signed)
Triage/epic 

## 2012-08-25 ENCOUNTER — Emergency Department (HOSPITAL_COMMUNITY)
Admission: EM | Admit: 2012-08-25 | Discharge: 2012-08-25 | Disposition: A | Discharge status: Home / Self Care | Payer: 59 | Attending: Emergency Medicine | Admitting: Emergency Medicine

## 2012-08-25 ENCOUNTER — Encounter (HOSPITAL_COMMUNITY): Payer: Self-pay | Admitting: Emergency Medicine

## 2012-08-25 DIAGNOSIS — K089 Disorder of teeth and supporting structures, unspecified: Secondary | ICD-10-CM | POA: Insufficient documentation

## 2012-08-25 DIAGNOSIS — K047 Periapical abscess without sinus: Secondary | ICD-10-CM

## 2012-08-25 DIAGNOSIS — R591 Generalized enlarged lymph nodes: Secondary | ICD-10-CM

## 2012-08-25 DIAGNOSIS — K044 Acute apical periodontitis of pulpal origin: Secondary | ICD-10-CM | POA: Insufficient documentation

## 2012-08-25 DIAGNOSIS — Z87891 Personal history of nicotine dependence: Secondary | ICD-10-CM | POA: Insufficient documentation

## 2012-08-25 DIAGNOSIS — Z79899 Other long term (current) drug therapy: Secondary | ICD-10-CM | POA: Insufficient documentation

## 2012-08-25 DIAGNOSIS — Z8659 Personal history of other mental and behavioral disorders: Secondary | ICD-10-CM | POA: Insufficient documentation

## 2012-08-25 DIAGNOSIS — R070 Pain in throat: Secondary | ICD-10-CM | POA: Insufficient documentation

## 2012-08-25 DIAGNOSIS — R599 Enlarged lymph nodes, unspecified: Secondary | ICD-10-CM | POA: Insufficient documentation

## 2012-08-25 DIAGNOSIS — K029 Dental caries, unspecified: Secondary | ICD-10-CM | POA: Insufficient documentation

## 2012-08-25 MED ORDER — CLINDAMYCIN HCL 300 MG PO CAPS: 300.0000 mg | ORAL_CAPSULE | Freq: Four times a day (QID) | ORAL | Status: DC

## 2012-08-25 NOTE — ED Notes (Signed)
Pt reports a 6 hr hx of throat tenderness and swelling on inner l/side

## 2012-08-25 NOTE — ED Provider Notes (Signed)
History    This chart was scribed for non-physician practitioner working with Nelia Shi, MD by ED Scribe Burman Nieves. This patient was seen in room WTR5/WTR5 and the patient's care was started at 3:30 PM.   CSN: [id number]  Arrival date & time 08/25/12  1512      No chief complaint on file.   (Consider location/radiation/quality/duration/timing/severity/associated sxs/prior treatment) The history is provided by the patient. No language interpreter was used.    Sherri Williams is a 27 y.o. female who presents to the Emergency Department complaining of moderate intermittent throat pain onset this morning. She states the severity is a 6/10. She was eating beef jerky at work and she started to feel a lump going down into the left side of her throat making it complicated to swallow. She complains the pain is on the left interior of her throat. She reports while she was eating a sub and opening her mouth made the pain worse. Pt had a sinus infection about a month ago. Pt denies fever, chills, cough, nausea, vomiting, diarrhea, SOB, weakness, and any other associated symptoms.    Past Medical History  Diagnosis Date  . Depression     Past Surgical History  Procedure Laterality Date  . Cesarean section  2011    LTCS  2-layer closure  . Tonsillectomy      Family History  Problem Relation Age of Onset  . Sickle cell trait Mother   . Sickle cell trait Brother   . Hypertension Maternal Grandmother   . Diabetes Maternal Grandmother     History  Substance Use Topics  . Smoking status: Former Smoker    Quit date: 12/29/2010  . Smokeless tobacco: Never Used  . Alcohol Use: No    OB History   Grav Para Term Preterm Abortions TAB SAB Ect Mult Living   2 2 2  0 0 0 0 0 0 2      Review of Systems  Constitutional: Negative for fever and chills.  Respiratory: Negative for shortness of breath.   Gastrointestinal: Negative for nausea and vomiting.  Neurological: Negative for  weakness.  All other systems reviewed and are negative.       Allergies  Amoxicillin  Home Medications   Current Outpatient Rx  Name  Route  Sig  Dispense  Refill  . Fe-Succ Ac-C-Thre Ac-B12-FA (NIFEREX-150 FORTE) 50-100 MG CAPS   Oral   Take 150 mg by mouth daily.   30 each   2   . levonorgestrel-ethinyl estradiol (SEASONALE,INTROVALE,JOLESSA) 0.15-0.03 MG tablet   Oral   Take 1 tablet by mouth daily.   1 Package   0   . Prenatal Vit-Fe Fumarate-FA (PRENATAL MULTIVITAMIN) TABS   Oral   Take 1 tablet by mouth daily.           There were no vitals taken for this visit.  Physical Exam  Nursing note and vitals reviewed. Constitutional: She is oriented to person, place, and time. She appears well-developed and well-nourished. No distress.  HENT:  Head: Normocephalic and atraumatic.  Mouth/Throat: Uvula is midline, oropharynx is clear and moist and mucous membranes are normal. No oropharyngeal exudate, posterior oropharyngeal edema, posterior oropharyngeal erythema or tonsillar abscesses.    Eyes: Conjunctivae and EOM are normal. Pupils are equal, round, and reactive to light.  Neck: Normal range of motion. Neck supple.  Cardiovascular: Normal rate, regular rhythm and normal heart sounds.   Pulmonary/Chest: Effort normal and breath sounds normal. No respiratory distress.  No rales or crackles No wheezing   Abdominal: Soft. Bowel sounds are normal. She exhibits no distension.  Musculoskeletal: Normal range of motion. She exhibits no edema.  Lymphadenopathy:       Head (left side): Submandibular (tender) adenopathy present. No submental and no tonsillar adenopathy present.    She has no cervical adenopathy.  Neurological: She is alert and oriented to person, place, and time. No sensory deficit.  Skin: Skin is warm and dry.  Psychiatric: She has a normal mood and affect. Her behavior is normal.    ED Course  Procedures (including critical care time)  DIAGNOSTIC  STUDIES: Oxygen Saturation is 98% on room air, normal by my interpretation.    COORDINATION OF CARE:  3:37 PM Discussed ED treatment with pt and pt agrees.      Labs Reviewed - No data to display No results found.   1. Dental infection   2. Adenopathy       MDM   Dental infection with dental caries and tooth decay along with submandibular adenopathy. No evidence of dental abscess. Patient is afebrile, non toxic appearing and swallowing secretions well. I gave patient referral to dentist and stressed the importance of dental follow up for ultimate management. Advised ibuprofen for throat pain. Oropharynx unremarkable. Rx clindamycin due to patient's PCN allergy. Patient voices understanding and is agreeable to plan.       I personally performed the services described in this documentation, which was scribed in my presence. The recorded information has been reviewed and is accurate.   Trevor Mace, PA-C 08/25/12 1555  Trevor Mace, PA-C 08/25/12 2312

## 2012-08-29 NOTE — ED Provider Notes (Signed)
Medical screening examination/treatment/procedure(s) were performed by non-physician practitioner and as supervising physician I was immediately available for consultation/collaboration.    Nelia Shi, MD 08/29/12 [phone]

## 2012-12-06 ENCOUNTER — Ambulatory Visit: Payer: 59 | Admitting: Family Medicine

## 2012-12-06 VITALS — BP 110/74 | HR 78 | Temp 97.9°F | Resp 16 | Ht 66.0 in | Wt 202.0 lb

## 2012-12-06 DIAGNOSIS — M531 Cervicobrachial syndrome: Secondary | ICD-10-CM

## 2012-12-06 DIAGNOSIS — M5481 Occipital neuralgia: Secondary | ICD-10-CM

## 2012-12-06 MED ORDER — GABAPENTIN 300 MG PO CAPS: 300.0000 mg | ORAL_CAPSULE | Freq: Three times a day (TID) | ORAL | Status: DC

## 2012-12-06 NOTE — Progress Notes (Signed)
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  No relief from aleve or ibuprofen.  The pain may last for several minutes, and she has to log off work until the pain goes away.  Two children, 2 and 1.  Objective:  NAD Mildly tender occipital area of scalp Neck:  Supple without abnormal contour or tenderness Neuro:  Moving 4 extremities normally  Assessment:  Occipital neuralgia  Plan: gabapentin 300 mg qd x 1 month  Signed,  Elvina Sidle, MD

## 2012-12-06 NOTE — Patient Instructions (Addendum)
Occipital Neuralgia Neuralgias are attacks of sharp stabbing pain. They may be intermittent (comes and goes) or constant in nature. They may be brief attacks that last seconds to minutes and may come back for days to weeks. The neuralgias can occur as a result of a herpes zoster (shingles), chickenpox infection, or even following a herpes simplex infection (cold sore). TYPES OF NEURALGIA  When these pains are located in the back of the head and neck they are called occipital neuralgias.  When the pain is located between ribs it is called intercostal neuralgia.  When the pain is located in the face it is called trigeminal neuralgia. This is the most common neuralgia. It causes sharp, shock like pain on one side of your face. The neuralgias, which follow herpes zoster infections, often produce a constant burning pain. They may last from weeks to months and even years. The attacks of pain may come from injury or inflammation (irritation) to a nerve. Often the cause is unknown. The episodes of pain may be caused by light touch, movement, or even eating and sneezing. Usually these neuralgias occur after age forty. The neuralgias following shingles and trigeminal neuralgia are the most common. Although painful, these episodes do not threaten life and tend to lessen as we grow older. TREATMENT  There are many medications that may be helpful in the treatment of this disorder. Sometimes several medications may have to be tried before the right combination can be found for you. Some of these medications are:  Only take over-the-counter or prescription medications for pain, discomfort, or fever as directed by your caregiver.  Narcotic medications may be used to control the pain.  Antidepressants and medications used in epilepsy (seizure disorders) may be useful. LET YOUR CAREGIVER KNOW ABOUT:  If you do not obtain relief from medications.  Problems that are getting worse rather than better.  Troubling  side effects that you think are coming from the medication. Do not be discouraged if you do not obtain instant relief from the medications or help given you. Your caregiver can help you get through these episodes of pain with some persistence (continued trying) on your part also. Document Released: 06/10/2001 Document Revised: 09/08/2011 Document Reviewed: 06/16/2005 ExitCare Patient Information 2014 [employer].  

## 2013-07-20 ENCOUNTER — Encounter (HOSPITAL_COMMUNITY): Payer: Self-pay | Admitting: Emergency Medicine

## 2013-07-20 ENCOUNTER — Emergency Department (HOSPITAL_COMMUNITY)
Admission: EM | Admit: 2013-07-20 | Discharge: 2013-07-20 | Disposition: A | Discharge status: Home / Self Care | Payer: 59 | Attending: Emergency Medicine | Admitting: Emergency Medicine

## 2013-07-20 DIAGNOSIS — Z79899 Other long term (current) drug therapy: Secondary | ICD-10-CM | POA: Insufficient documentation

## 2013-07-20 DIAGNOSIS — F3289 Other specified depressive episodes: Secondary | ICD-10-CM | POA: Insufficient documentation

## 2013-07-20 DIAGNOSIS — M546 Pain in thoracic spine: Secondary | ICD-10-CM | POA: Insufficient documentation

## 2013-07-20 DIAGNOSIS — F17200 Nicotine dependence, unspecified, uncomplicated: Secondary | ICD-10-CM | POA: Insufficient documentation

## 2013-07-20 DIAGNOSIS — M542 Cervicalgia: Secondary | ICD-10-CM | POA: Insufficient documentation

## 2013-07-20 DIAGNOSIS — F329 Major depressive disorder, single episode, unspecified: Secondary | ICD-10-CM | POA: Insufficient documentation

## 2013-07-20 MED ORDER — CYCLOBENZAPRINE HCL 10 MG PO TABS: 10.0000 mg | ORAL_TABLET | Freq: Two times a day (BID) | ORAL | Status: DC | PRN

## 2013-07-20 MED ORDER — HYDROCODONE-ACETAMINOPHEN 5-325 MG PO TABS: 2.0000 | ORAL_TABLET | ORAL | Status: DC | PRN

## 2013-07-20 NOTE — Discharge Instructions (Signed)
Take Flexeril as needed for muscle spasm. Take Vicodin as needed for pain. You may take these medications together. Apply a heating pad to the affected area for as long as you can tolerate.

## 2013-07-20 NOTE — ED Provider Notes (Signed)
CSN: [id number]     Arrival date & time 07/20/13  1245 History  This chart was scribed for non-physician practitioner Emilia BeckKaitlyn Akyia Borelli, PA-C working with Laray AngerKathleen M McManus, DO by Leone PayorSonum Patel, ED Scribe. This patient was seen in room WTR5/WTR5 and the patient's care was started at 1245.     Chief Complaint  Patient presents with  . Neck Pain    The history is provided by the patient. No language interpreter was used.    HPI Comments: Sherri Williams is a 28 y.o. female who presents to the Emergency Department complaining of 2 days of gradual onset, constant, gradually worsening neck pain. She reports having pain to the upper back and posterior head. She describes this pain similar to a "crick" in her neck but states this feels worse. She denies any injuries or trauma to the affected area. She states this pain is worse with certain movements. She has applied warm compresses and Aleve without relief. She denies fever.   Past Medical History  Diagnosis Date  . Depression    Past Surgical History  Procedure Laterality Date  . Cesarean section  2011    LTCS  2-layer closure  . Tonsillectomy     Family History  Problem Relation Age of Onset  . Sickle cell trait Mother   . Sickle cell trait Brother   . Hypertension Maternal Grandmother   . Diabetes Maternal Grandmother    History  Substance Use Topics  . Smoking status: Current Every Day Smoker    Last Attempt to Quit: 12/29/2010  . Smokeless tobacco: Never Used  . Alcohol Use: No   OB History   Grav Para Term Preterm Abortions TAB SAB Ect Mult Living   2 2 2  0 0 0 0 0 0 2     Review of Systems  Constitutional: Negative for fever, chills and fatigue.  HENT: Negative for trouble swallowing.   Eyes: Negative for visual disturbance.  Respiratory: Negative for shortness of breath.   Cardiovascular: Negative for chest pain and palpitations.  Gastrointestinal: Negative for nausea, vomiting, abdominal pain and diarrhea.   Genitourinary: Negative for dysuria and difficulty urinating.  Musculoskeletal: Positive for back pain (upper back pain) and neck pain. Negative for arthralgias.  Skin: Negative for color change.  Neurological: Negative for dizziness and weakness.  Psychiatric/Behavioral: Negative for dysphoric mood.    Allergies  Amoxicillin  Home Medications   Current Outpatient Rx  Name  Route  Sig  Dispense  Refill  . naproxen sodium (ANAPROX) 220 MG tablet   Oral   Take 440 mg by mouth 2 (two) times daily as needed (pain).         . EXPIRED: levonorgestrel-ethinyl estradiol (SEASONALE,INTROVALE,JOLESSA) 0.15-0.03 MG tablet   Oral   Take 1 tablet by mouth daily.   1 Package   0    BP 124/69  Pulse 72  Temp(Src) 98 F (36.7 C) (Oral)  Resp 16  SpO2 100%  LMP 06/30/2013 Physical Exam  Nursing note and vitals reviewed. Constitutional: She is oriented to person, place, and time. She appears well-developed and well-nourished. No distress.  HENT:  Head: Normocephalic and atraumatic.  Eyes: Conjunctivae are normal.  Neck:  ROM limited due to pain.   Cardiovascular: Normal rate and regular rhythm.  Exam reveals no gallop and no friction rub.   No murmur heard. Pulmonary/Chest: Effort normal and breath sounds normal. She has no wheezes. She has no rales. She exhibits no tenderness.  Abdominal: Soft. She exhibits  no distension. There is no tenderness.  Musculoskeletal: Normal range of motion.  No midline spine tenderness to palpation. Paraspinal cervical tenderness and bilateral trapezius tenderness upon palpation. No obvious deformity.   Neurological: She is alert and oriented to person, place, and time.  No meningeal signs.   Skin: Skin is warm and dry.  Psychiatric: She has a normal mood and affect. Her behavior is normal.    ED Course  Procedures (including critical care time)  DIAGNOSTIC STUDIES: Oxygen Saturation is 100% on RA, normal by my interpretation.    COORDINATION  OF CARE: 1:43 PM Discussed treatment plan with pt at bedside and pt agreed to plan.   Labs Review Labs Reviewed - No data to display Imaging Review No results found.  EKG Interpretation   None       MDM   1. Neck pain, bilateral    Patient's pain is likely muscular in nature. Patient will be discharged with vicodin and flexeril for pain. Patient advised to apply heat to the affected area. Patient denies any injury. No midline spine tenderness. No imaging indicated at this time.   I personally performed the services described in this documentation, which was scribed in my presence. The recorded information has been reviewed and is accurate.    Emilia Beck, PA-C 07/20/13 1756

## 2013-07-20 NOTE — ED Notes (Signed)
Pt escorted to discharge window. Pt verbalized understanding discharge instructions. In no acute distress.  

## 2013-07-20 NOTE — ED Notes (Signed)
Pt states that she started having neck pain after getting off work last night.  States that it has gotten worse this morning, worse with movement.

## 2013-07-21 NOTE — ED Provider Notes (Signed)
Medical screening examination/treatment/procedure(s) were performed by non-physician practitioner and as supervising physician I was immediately available for consultation/collaboration.  EKG Interpretation   None        Akhil Piscopo R. Nayana Lenig, MD 07/21/13 0003 

## 2013-07-25 ENCOUNTER — Emergency Department (HOSPITAL_COMMUNITY)
Admission: EM | Admit: 2013-07-25 | Discharge: 2013-07-25 | Disposition: A | Discharge status: Home / Self Care | Payer: 59 | Attending: Emergency Medicine | Admitting: Emergency Medicine

## 2013-07-25 ENCOUNTER — Encounter (HOSPITAL_COMMUNITY): Payer: Self-pay | Admitting: Emergency Medicine

## 2013-07-25 ENCOUNTER — Emergency Department (HOSPITAL_COMMUNITY): Payer: 59

## 2013-07-25 DIAGNOSIS — F17200 Nicotine dependence, unspecified, uncomplicated: Secondary | ICD-10-CM | POA: Insufficient documentation

## 2013-07-25 DIAGNOSIS — Z880 Allergy status to penicillin: Secondary | ICD-10-CM | POA: Insufficient documentation

## 2013-07-25 DIAGNOSIS — S139XXA Sprain of joints and ligaments of unspecified parts of neck, initial encounter: Secondary | ICD-10-CM | POA: Insufficient documentation

## 2013-07-25 DIAGNOSIS — Y9389 Activity, other specified: Secondary | ICD-10-CM | POA: Insufficient documentation

## 2013-07-25 DIAGNOSIS — X500XXA Overexertion from strenuous movement or load, initial encounter: Secondary | ICD-10-CM | POA: Insufficient documentation

## 2013-07-25 DIAGNOSIS — Z8659 Personal history of other mental and behavioral disorders: Secondary | ICD-10-CM | POA: Insufficient documentation

## 2013-07-25 DIAGNOSIS — S161XXA Strain of muscle, fascia and tendon at neck level, initial encounter: Secondary | ICD-10-CM

## 2013-07-25 DIAGNOSIS — Y929 Unspecified place or not applicable: Secondary | ICD-10-CM | POA: Insufficient documentation

## 2013-07-25 DIAGNOSIS — R51 Headache: Secondary | ICD-10-CM | POA: Insufficient documentation

## 2013-07-25 MED ORDER — CYCLOBENZAPRINE HCL 10 MG PO TABS: 10.0000 mg | ORAL_TABLET | Freq: Two times a day (BID) | ORAL | Status: DC | PRN

## 2013-07-25 MED ORDER — HYDROCODONE-ACETAMINOPHEN 5-325 MG PO TABS: 2.0000 | ORAL_TABLET | ORAL | Status: DC | PRN

## 2013-07-25 NOTE — ED Provider Notes (Signed)
Medical screening examination/treatment/procedure(s) were performed by non-physician practitioner and as supervising physician I was immediately available for consultation/collaboration.  EKG Interpretation   None         Lashaunta Sicard N Lisa Milian, DO 07/25/13 2301 

## 2013-07-25 NOTE — ED Provider Notes (Signed)
CSN: [id number]     Arrival date & time 07/25/13  1500 History  This chart was scribed for non-physician practitioner, Mardee Postin, working with Layla Maw Ward, DO by Smiley Houseman, ED Scribe. This patient was seen in room WTR6/WTR6 and the patient's care was started at 5:12 PM.  Chief Complaint  Patient presents with  . Neck Pain   The history is provided by the patient. No language interpreter was used.   HPI Comments: Sherri Williams is a 28 y.o. female who presents to the Emergency Department complaining of constant gradually worsening sharp neck pain with an associated HA, that started about 6 days ago.  Pt states that she woke up Wednesday morning with pain, which felt like a crick in her neck.  Pt states this weekend she experienced weakness in her arms, but denies numbness and tingling.  Pt states the pain worsens when she turns her head to the right.  Pt denies any known injury to her neck.    Past Medical History  Diagnosis Date  . Depression    Past Surgical History  Procedure Laterality Date  . Cesarean section  2011    LTCS  2-layer closure  . Tonsillectomy     Family History  Problem Relation Age of Onset  . Sickle cell trait Mother   . Sickle cell trait Brother   . Hypertension Maternal Grandmother   . Diabetes Maternal Grandmother    History  Substance Use Topics  . Smoking status: Current Every Day Smoker    Last Attempt to Quit: 12/29/2010  . Smokeless tobacco: Never Used  . Alcohol Use: No   OB History   Grav Para Term Preterm Abortions TAB SAB Ect Mult Living   2 2 2  0 0 0 0 0 0 2     Review of Systems  Constitutional: Negative for fever and chills.  HENT: Negative for congestion and rhinorrhea.   Respiratory: Negative for cough and shortness of breath.   Cardiovascular: Negative for chest pain.  Gastrointestinal: Negative for nausea, vomiting, abdominal pain and diarrhea.  Musculoskeletal: Positive for neck pain. Negative for back pain.  Skin:  Negative for color change and rash.  Neurological: Positive for headaches. Negative for syncope.  All other systems reviewed and are negative.    Allergies  Amoxicillin  Home Medications   Current Outpatient Rx  Name  Route  Sig  Dispense  Refill  . cyclobenzaprine (FLEXERIL) 10 MG tablet   Oral   Take 1 tablet (10 mg total) by mouth 2 (two) times daily as needed for muscle spasms.   20 tablet   0   . HYDROcodone-acetaminophen (NORCO/VICODIN) 5-325 MG per tablet   Oral   Take 2 tablets by mouth every 4 (four) hours as needed.   12 tablet   0   . EXPIRED: levonorgestrel-ethinyl estradiol (SEASONALE,INTROVALE,JOLESSA) 0.15-0.03 MG tablet   Oral   Take 1 tablet by mouth daily.   1 Package   0    Triage Vitals:BP 120/71  Pulse 76  Temp(Src) 98.3 F (36.8 C) (Oral)  Resp 16  SpO2 100%  LMP 07/25/2013  Breastfeeding? No Physical Exam  Nursing note and vitals reviewed. Constitutional: She is oriented to person, place, and time. She appears well-developed and well-nourished. No distress.  HENT:  Head: Normocephalic and atraumatic.  Right Ear: External ear normal.  Left Ear: External ear normal.  Nose: Nose normal.  Mouth/Throat: Oropharynx is clear and moist. No oropharyngeal exudate.  Eyes: Conjunctivae are  normal. Pupils are equal, round, and reactive to light. No scleral icterus.  Neck: Neck supple. Spinous process tenderness and muscular tenderness present.  ROM decreased with turning head to right due to pain  Cardiovascular: Normal rate, regular rhythm and normal heart sounds.  Exam reveals no gallop and no friction rub.   No murmur heard. Pulmonary/Chest: Effort normal and breath sounds normal. No respiratory distress. She has no wheezes. She has no rales. She exhibits no tenderness.  Abdominal: Soft. Bowel sounds are normal. She exhibits no distension. There is no tenderness.  Musculoskeletal: Normal range of motion. She exhibits no edema and no tenderness.   Neurological: She is alert and oriented to person, place, and time. She has normal reflexes. She exhibits normal muscle tone. Coordination normal.  Skin: Skin is warm and dry. No rash noted. No erythema. No pallor.  Psychiatric: She has a normal mood and affect. Her behavior is normal. Judgment and thought content normal.    ED Course  Procedures (including critical care time) DIAGNOSTIC STUDIES: Oxygen Saturation is 100% on RA, normal by my interpretation.    COORDINATION OF CARE: 5:20 PM-Will order neck x-ray.  Patient informed of current plan of treatment and evaluation and agrees with plan.    Labs Review Labs Reviewed - No data to display Imaging Review No results found.  EKG Interpretation   None      Results for orders placed during the hospital encounter of 08/15/11  STREP B DNA PROBE      Result Value Range   GBS Negative    CBC      Result Value Range   WBC 7.3  4.0 - 10.5 K/uL   RBC 3.89  3.87 - 5.11 MIL/uL   Hemoglobin 10.3 (*) 12.0 - 15.0 g/dL   HCT 16.1 (*) 09.6 - 04.5 %   MCV 82.0  78.0 - 100.0 fL   MCH 26.5  26.0 - 34.0 pg   MCHC 32.3  30.0 - 36.0 g/dL   RDW 40.9 (*) 81.1 - 91.4 %   Platelets 212  150 - 400 K/uL  RPR      Result Value Range   RPR NON REACTIVE  NON REACTIVE  HIV ANTIBODY (ROUTINE TESTING)      Result Value Range   HIV Non-reactive    RUBELLA ANTIBODY, IGM      Result Value Range   Rubella Immune    RPR      Result Value Range   RPR Nonreactive    GC/CHLAMYDIA PROBE AMP, GENITAL      Result Value Range   Gonorrhea Negative     Chlamydia Negative    CBC      Result Value Range   WBC 11.4 (*) 4.0 - 10.5 K/uL   RBC 3.63 (*) 3.87 - 5.11 MIL/uL   Hemoglobin 9.6 (*) 12.0 - 15.0 g/dL   HCT 78.2 (*) 95.6 - 21.3 %   MCV 81.5  78.0 - 100.0 fL   MCH 26.4  26.0 - 34.0 pg   MCHC 32.4  30.0 - 36.0 g/dL   RDW 08.6 (*) 57.8 - 46.9 %   Platelets 169  150 - 400 K/uL  ABO/RH      Result Value Range   RH Type  Positive     ABO Grouping  A    ANTIBODY SCREEN      Result Value Range   Antibody Screen Negative     Dg Cervical Spine Complete  07/25/2013  CLINICAL DATA:  Neck pain  EXAM: CERVICAL SPINE  4+ VIEWS  COMPARISON:  None.  FINDINGS: There is no evidence of cervical spine fracture or prevertebral soft tissue swelling. Alignment is normal. No other significant bone abnormalities are identified.  IMPRESSION: Negative cervical spine radiographs.   Electronically Signed   By: Ruel Favorsrevor  Shick M.D.   On: 07/25/2013 18:56     MDM  Cervical strain  Patient here with continued right sided neck pain despite medicaton - she is concerned there is something worse going on - x-rays negative, patient reassured.  No alarming signs to suggest cauda equina.    I personally performed the services described in this documentation, which was scribed in my presence. The recorded information has been reviewed and is accurate.     Izola PriceFrances C. Marisue HumbleSanford, New JerseyPA-C 07/25/13 1911

## 2013-07-25 NOTE — Discharge Instructions (Signed)
Cervical Sprain  A cervical sprain is an injury in the neck in which the strong, fibrous tissues (ligaments) that connect your neck bones stretch or tear. Cervical sprains can range from mild to severe. Severe cervical sprains can cause the neck vertebrae to be unstable. This can lead to damage of the spinal cord and can result in serious nervous system problems. The amount of time it takes for a cervical sprain to get better depends on the cause and extent of the injury. Most cervical sprains heal in 1 to 3 weeks.  CAUSES   Severe cervical sprains may be caused by:   · Contact sport injuries (such as from football, rugby, wrestling, hockey, auto racing, gymnastics, diving, martial arts, or boxing).    · Motor vehicle collisions.    · Whiplash injuries. This is an injury from a sudden forward-and backward whipping movement of the head and neck.   · Falls.    Mild cervical sprains may be caused by:   · Being in an awkward position, such as while cradling a telephone between your ear and shoulder.    · Sitting in a chair that does not offer proper support.    · Working at a poorly designed computer station.    · Looking up or down for long periods of time.    SYMPTOMS   · Pain, soreness, stiffness, or a burning sensation in the front, back, or sides of the neck. This discomfort may develop immediately after the injury or slowly, 24 hours or more after the injury.    · Pain or tenderness directly in the middle of the back of the neck.    · Shoulder or upper back pain.    · Limited ability to move the neck.    · Headache.    · Dizziness.    · Weakness, numbness, or tingling in the hands or arms.    · Muscle spasms.    · Difficulty swallowing or chewing.    · Tenderness and swelling of the neck.    DIAGNOSIS   Most of the time your health care provider can diagnose a cervical sprain by taking your history and doing a physical exam. Your health care provider will ask about previous neck injuries and any known neck  problems, such as arthritis in the neck. X-rays may be taken to find out if there are any other problems, such as with the bones of the neck. Other tests, such as a CT scan or MRI, may also be needed.   TREATMENT   Treatment depends on the severity of the cervical sprain. Mild sprains can be treated with rest, keeping the neck in place (immobilization), and pain medicines. Severe cervical sprains are immediately immobilized. Further treatment is done to help with pain, muscle spasms, and other symptoms and may include:  · Medicines, such as pain relievers, numbing medicines, or muscle relaxants.    · Physical therapy. This may involve stretching exercises, strengthening exercises, and posture training. Exercises and improved posture can help stabilize the neck, strengthen muscles, and help stop symptoms from returning.    HOME CARE INSTRUCTIONS   · Put ice on the injured area.    · Put ice in a plastic bag.    · Place a towel between your skin and the bag.    · Leave the ice on for 15 20 minutes, 3 4 times a day.    · If your injury was severe, you may have been given a cervical collar to wear. A cervical collar is a two-piece collar designed to keep your neck from moving while it heals.  · Do   not remove the collar unless instructed by your health care provider.  · If you have long hair, keep it outside of the collar.  · Ask your health care provider before making any adjustments to your collar. Minor adjustments may be required over time to improve comfort and reduce pressure on your chin or on the back of your head.  · If you are allowed to remove the collar for cleaning or bathing, follow your health care provider's instructions on how to do so safely.  · Keep your collar clean by wiping it with mild soap and water and drying it completely. If the collar you have been given includes removable pads, remove them every 1 2 days and hand wash them with soap and water. Allow them to air dry. They should be completely  dry before you wear them in the collar.  · If you are allowed to remove the collar for cleaning and bathing, wash and dry the skin of your neck. Check your skin for irritation or sores. If you see any, tell your health care provider.  · Do not drive while wearing the collar.    · Only take over-the-counter or prescription medicines for pain, discomfort, or fever as directed by your health care provider.    · Keep all follow-up appointments as directed by your health care provider.    · Keep all physical therapy appointments as directed by your health care provider.    · Make any needed adjustments to your workstation to promote good posture.    · Avoid positions and activities that make your symptoms worse.    · Warm up and stretch before being active to help prevent problems.    SEEK MEDICAL CARE IF:   · Your pain is not controlled with medicine.    · You are unable to decrease your pain medicine over time as planned.    · Your activity level is not improving as expected.    SEEK IMMEDIATE MEDICAL CARE IF:   · You develop any bleeding.  · You develop stomach upset.  · You have signs of an allergic reaction to your medicine.    · Your symptoms get worse.    · You develop new, unexplained symptoms.    · You have numbness, tingling, weakness, or paralysis in any part of your body.    MAKE SURE YOU:   · Understand these instructions.  · Will watch your condition.  · Will get help right away if you are not doing well or get worse.  Document Released: 04/13/2007 Document Revised: 04/06/2013 Document Reviewed: 12/22/2012  ExitCare® Patient Information ©2014 [employer].  Muscle Strain  A muscle strain is an injury that occurs when a muscle is stretched beyond its normal length. Usually a small number of muscle fibers are torn when this happens. Muscle strain is rated in degrees. First-degree strains have the least amount of muscle fiber tearing and pain. Second-degree and third-degree strains have increasingly more  tearing and pain.   Usually, recovery from muscle strain takes 1 2 weeks. Complete healing takes 5 6 weeks.   CAUSES   Muscle strain happens when a sudden, violent force placed on a muscle stretches it too far. This may occur with lifting, sports, or a fall.   RISK FACTORS  Muscle strain is especially common in athletes.   SIGNS AND SYMPTOMS  At the site of the muscle strain, there may be:  · Pain.  · Bruising.  · Swelling.  · Difficulty using the muscle due to pain or lack of normal function.  DIAGNOSIS     Your health care provider will perform a physical exam and ask about your medical history.  TREATMENT   Often, the best treatment for a muscle strain is resting, icing, and applying cold compresses to the injured area.    HOME CARE INSTRUCTIONS   · Use the PRICE method of treatment to promote muscle healing during the first 2 3 days after your injury. The PRICE method involves:  · Protecting the muscle from being injured again.  · Restricting your activity and resting the injured body part.  · Icing your injury. To do this, put ice in a plastic bag. Place a towel between your skin and the bag. Then, apply the ice and leave it on from 15 20 minutes each hour. After the third day, switch to moist heat packs.  · Apply compression to the injured area with a splint or elastic bandage. Be careful not to wrap it too tightly. This may interfere with blood circulation or increase swelling.  · Elevate the injured body part above the level of your heart as often as you can.  · Only take over-the-counter or prescription medicines for pain, discomfort, or fever as directed by your health care provider.  · Warming up prior to exercise helps to prevent future muscle strains.  SEEK MEDICAL CARE IF:   · You have increasing pain or swelling in the injured area.  · You have numbness, tingling, or a significant loss of strength in the injured area.  MAKE SURE YOU:   · Understand these instructions.  · Will watch your  condition.  · Will get help right away if you are not doing well or get worse.  Document Released: 06/16/2005 Document Revised: 04/06/2013 Document Reviewed: 01/13/2013  ExitCare® Patient Information ©2014 [employer].

## 2013-07-25 NOTE — ED Notes (Signed)
Pt presents with c/o neck pain; pt was seen recently for same and d/c with musculoskeletal pain and pain is still not any better

## 2013-07-25 NOTE — ED Notes (Signed)
Pt seen 5 days ago for neck pain. Reports no relief from Meds given. Denies injury.

## 2014-04-26 ENCOUNTER — Encounter (HOSPITAL_COMMUNITY): Payer: Self-pay | Admitting: Emergency Medicine

## 2014-04-26 ENCOUNTER — Emergency Department (HOSPITAL_COMMUNITY)
Admission: EM | Admit: 2014-04-26 | Discharge: 2014-04-26 | Disposition: A | Discharge status: Home / Self Care | Payer: 59 | Attending: Emergency Medicine | Admitting: Emergency Medicine

## 2014-04-26 DIAGNOSIS — Z79899 Other long term (current) drug therapy: Secondary | ICD-10-CM | POA: Insufficient documentation

## 2014-04-26 DIAGNOSIS — Z880 Allergy status to penicillin: Secondary | ICD-10-CM | POA: Insufficient documentation

## 2014-04-26 DIAGNOSIS — M542 Cervicalgia: Secondary | ICD-10-CM | POA: Insufficient documentation

## 2014-04-26 DIAGNOSIS — Z8659 Personal history of other mental and behavioral disorders: Secondary | ICD-10-CM | POA: Insufficient documentation

## 2014-04-26 DIAGNOSIS — Z720 Tobacco use: Secondary | ICD-10-CM | POA: Insufficient documentation

## 2014-04-26 MED ORDER — CYCLOBENZAPRINE HCL 10 MG PO TABS: 10.0000 mg | ORAL_TABLET | Freq: Two times a day (BID) | ORAL | Status: DC | PRN

## 2014-04-26 NOTE — ED Notes (Signed)
Pt complaining of right sided neck pain and spasms with movement.

## 2014-04-26 NOTE — ED Provider Notes (Signed)
CSN: [id number]     Arrival date & time 04/26/14  1116 History  This chart was scribed for non-physician practitioner, Emilia BeckKaitlyn Nichoel Digiulio, PA-C working with Doug SouSam Jacubowitz, MD by Greggory StallionKayla Andersen, ED scribe. This patient was seen in room TR05C/TR05C and the patient's care was started at 11:43 AM.   Chief Complaint  Patient presents with  . Neck Pain   The history is provided by the patient. No language interpreter was used.   HPI Comments: Sherri Williams is a 28 y.o. female who presents to the Emergency Department complaining of right sided neck pain that started yesterday. Describes the pain as a spasm. Denies injury. States she had similar pain earlier this year and was told it was muscle spasms. Turning her neck worsens the pain. She has taken motrin and used a heating pad with no relief. Pt does not have a PCP.   Past Medical History  Diagnosis Date  . Depression    Past Surgical History  Procedure Laterality Date  . Cesarean section  2011    LTCS  2-layer closure  . Tonsillectomy     Family History  Problem Relation Age of Onset  . Sickle cell trait Mother   . Sickle cell trait Brother   . Hypertension Maternal Grandmother   . Diabetes Maternal Grandmother    History  Substance Use Topics  . Smoking status: Current Every Day Smoker    Last Attempt to Quit: 12/29/2010  . Smokeless tobacco: Never Used  . Alcohol Use: No   OB History   Grav Para Term Preterm Abortions TAB SAB Ect Mult Living   2 2 2  0 0 0 0 0 0 2     Review of Systems  Musculoskeletal: Positive for neck pain.  All other systems reviewed and are negative.  Allergies  Amoxicillin  Home Medications   Prior to Admission medications   Medication Sig Start Date End Date Taking? Authorizing Provider  cyclobenzaprine (FLEXERIL) 10 MG tablet Take 1 tablet (10 mg total) by mouth 2 (two) times daily as needed for muscle spasms. 07/25/13   Izola PriceFrances C. Sanford, PA-C  HYDROcodone-acetaminophen (NORCO/VICODIN)  5-325 MG per tablet Take 2 tablets by mouth every 4 (four) hours as needed. 07/25/13   Izola PriceFrances C. Sanford, PA-C  levonorgestrel-ethinyl estradiol (SEASONALE,INTROVALE,JOLESSA) 0.15-0.03 MG tablet Take 1 tablet by mouth daily. 12/17/11 12/16/12  Kirkland HunArthur Stringer, MD   BP 125/68  Pulse 86  Temp(Src) 98 F (36.7 C) (Oral)  Resp 18  Wt 179 lb 2 oz (81.251 kg)  SpO2 96%  Physical Exam  Nursing note and vitals reviewed. Constitutional: She is oriented to person, place, and time. She appears well-developed and well-nourished. No distress.  HENT:  Head: Normocephalic and atraumatic.  Eyes: Conjunctivae and EOM are normal.  Neck: Neck supple. No tracheal deviation present.  Cardiovascular: Normal rate, regular rhythm and normal heart sounds.   Pulmonary/Chest: Effort normal and breath sounds normal. No respiratory distress. She has no wheezes. She has no rhonchi. She has no rales.  Musculoskeletal: Normal range of motion.  No midline spine tenderness to palpation. Right cervical paraspinal tenderness to palpation.   Neurological: She is alert and oriented to person, place, and time.  Skin: Skin is warm and dry.  Psychiatric: She has a normal mood and affect. Her behavior is normal.    ED Course  Procedures (including critical care time)  DIAGNOSTIC STUDIES: Oxygen Saturation is 96% on RA, normal by my interpretation.    COORDINATION OF CARE: 11:44  AM-Discussed treatment plan which includes a muscle relaxer and continuing warm compresses with pt at bedside and pt agreed to plan.   Labs Review Labs Reviewed - No data to display  Imaging Review No results found.   EKG Interpretation None      MDM   Final diagnoses:  Neck pain on right side   Patient likely having muscle spasm of right paraspinal muscles. Patient will have flexeril for pain. No xray needed at this time. Vitals stable and patient afebrile.    I personally performed the services described in this documentation,  which was scribed in my presence. The recorded information has been reviewed and is accurate.  Emilia BeckKaitlyn Raychell Holcomb, PA-C 04/28/14 1320

## 2014-04-26 NOTE — Discharge Instructions (Signed)
Take Flexeril as needed for muscle spasm. Continue to apply heat to the affected area. Refer to attached documents for more information. Follow up with your doctor for further evaluation.

## 2014-04-28 NOTE — ED Provider Notes (Signed)
Medical screening examination/treatment/procedure(s) were performed by non-physician practitioner and as supervising physician I was immediately available for consultation/collaboration.   EKG Interpretation None       Adith Tejada, MD 04/28/14 1544 

## 2014-05-01 ENCOUNTER — Encounter (HOSPITAL_COMMUNITY): Payer: Self-pay | Admitting: Emergency Medicine

## 2016-05-30 ENCOUNTER — Inpatient Hospital Stay (HOSPITAL_COMMUNITY)
Admission: AD | Admit: 2016-05-30 | Discharge: 2016-05-30 | Disposition: A | Discharge status: Home / Self Care | Payer: Self-pay | Source: Ambulatory Visit | Attending: Obstetrics & Gynecology | Admitting: Obstetrics & Gynecology

## 2016-05-30 ENCOUNTER — Inpatient Hospital Stay (HOSPITAL_COMMUNITY): Payer: Self-pay

## 2016-05-30 ENCOUNTER — Encounter (HOSPITAL_COMMUNITY): Payer: Self-pay | Admitting: *Deleted

## 2016-05-30 DIAGNOSIS — Z3A10 10 weeks gestation of pregnancy: Secondary | ICD-10-CM | POA: Insufficient documentation

## 2016-05-30 DIAGNOSIS — Z3491 Encounter for supervision of normal pregnancy, unspecified, first trimester: Secondary | ICD-10-CM

## 2016-05-30 DIAGNOSIS — F17200 Nicotine dependence, unspecified, uncomplicated: Secondary | ICD-10-CM | POA: Insufficient documentation

## 2016-05-30 DIAGNOSIS — O99331 Smoking (tobacco) complicating pregnancy, first trimester: Secondary | ICD-10-CM | POA: Insufficient documentation

## 2016-05-30 DIAGNOSIS — O26891 Other specified pregnancy related conditions, first trimester: Secondary | ICD-10-CM | POA: Insufficient documentation

## 2016-05-30 DIAGNOSIS — R109 Unspecified abdominal pain: Secondary | ICD-10-CM | POA: Insufficient documentation

## 2016-05-30 DIAGNOSIS — O99611 Diseases of the digestive system complicating pregnancy, first trimester: Secondary | ICD-10-CM | POA: Insufficient documentation

## 2016-05-30 DIAGNOSIS — K5229 Other allergic and dietetic gastroenteritis and colitis: Secondary | ICD-10-CM | POA: Insufficient documentation

## 2016-05-30 DIAGNOSIS — O26899 Other specified pregnancy related conditions, unspecified trimester: Secondary | ICD-10-CM

## 2016-05-30 LAB — URINALYSIS, ROUTINE W REFLEX MICROSCOPIC
Bilirubin Urine: NEGATIVE
GLUCOSE, UA: NEGATIVE mg/dL
KETONES UR: 15 mg/dL — AB
LEUKOCYTES UA: NEGATIVE
Nitrite: NEGATIVE
PH: 6 (ref 5.0–8.0)
Protein, ur: NEGATIVE mg/dL
Specific Gravity, Urine: 1.025 (ref 1.005–1.030)

## 2016-05-30 LAB — URINE MICROSCOPIC-ADD ON
Bacteria, UA: NONE SEEN
RBC / HPF: NONE SEEN RBC/hpf (ref 0–5)

## 2016-05-30 LAB — CBC
HCT: 30.2 % — ABNORMAL LOW (ref 36.0–46.0)
Hemoglobin: 10.4 g/dL — ABNORMAL LOW (ref 12.0–15.0)
MCH: 27.4 pg (ref 26.0–34.0)
MCHC: 34.4 g/dL (ref 30.0–36.0)
MCV: 79.7 fL (ref 78.0–100.0)
PLATELETS: 225 10*3/uL (ref 150–400)
RBC: 3.79 MIL/uL — ABNORMAL LOW (ref 3.87–5.11)
RDW: 15.2 % (ref 11.5–15.5)
WBC: 6.8 10*3/uL (ref 4.0–10.5)

## 2016-05-30 LAB — ABO/RH: ABO/RH(D): A POS

## 2016-05-30 LAB — POCT PREGNANCY, URINE: Preg Test, Ur: POSITIVE — AB

## 2016-05-30 LAB — HCG, QUANTITATIVE, PREGNANCY: HCG, BETA CHAIN, QUANT, S: 76008 m[IU]/mL — AB (ref <5)

## 2016-05-30 NOTE — MAU Note (Signed)
Pt states she took a HPT and it was positive a few weeks ago.  Starting around 0100 this morning she has begun to have bad abdominal cramping.  Pt states that this morning she started to have diarrhea.  Pt states she has gone about 3 or 4 times this morning.

## 2016-05-30 NOTE — Discharge Instructions (Signed)
Abdominal Pain During Pregnancy °Belly (abdominal) pain is common during pregnancy. Most of the time, it is not a serious problem. Other times, it can be a sign that something is wrong with the pregnancy. Always tell your doctor if you have belly pain. °Follow these instructions at home: °Monitor your belly pain for any changes. The following actions may help you feel better: °· Do not have sex (intercourse) or put anything in your vagina until you feel better. °· Rest until your pain stops. °· Drink clear fluids if you feel sick to your stomach (nauseous). Do not eat solid food until you feel better. °· Only take medicine as told by your doctor. °· Keep all doctor visits as told. °Get help right away if: °· You are bleeding, leaking fluid, or pieces of tissue come out of your vagina. °· You have more pain or cramping. °· You keep throwing up (vomiting). °· You have pain when you pee (urinate) or have blood in your pee. °· You have a fever. °· You do not feel your baby moving as much. °· You feel very weak or feel like passing out. °· You have trouble breathing, with or without belly pain. °· You have a very bad headache and belly pain. °· You have fluid leaking from your vagina and belly pain. °· You keep having watery poop (diarrhea). °· Your belly pain does not go away after resting, or the pain gets worse. °This information is not intended to replace advice given to you by your health care provider. Make sure you discuss any questions you have with your health care provider. °Document Released: 06/04/2009 Document Revised: 01/23/2016 Document Reviewed: 01/13/2013 °Elsevier Interactive Patient Education © 2017 [employer] ° °

## 2016-05-30 NOTE — MAU Provider Note (Signed)
Chief Complaint: Abdominal Pain and Diarrhea   SUBJECTIVE HPI: Sherri Williams is a 30 y.o. G3P2002 at 928w4d who presents to [facility] Admissions reporting cramping and diarrhea.  Last night started having left sided abdominal cramping persistent while sleeping, then went to bathroom had a normal BM, but became watery. Went to the bathroom 3-4 times throughout the night. No BMs this AM since 7am. Cramping persisted even after BMs, but is gone now. Felt sweats when in the bathroom but no overt fevers. No sick contacts. Ate salad last night. Had a steak and cheese sub from [employer] recently but otherwise no abnormal foods. Denies vomiting with this pain. But has been having nausea for weeks. Denies hematochezia. Questionable melena on last stool at 7am (states was black, loose stool with possible red in it). Never had anything like this before.   Patient's last menstrual period was 03/17/2016.  Did not expect pregnancy. Does not desire pregnancy.    Past Medical History:  Diagnosis Date  . Depression    OB History  Gravida Para Term Preterm AB Living  3 2 2  0 0 2  SAB TAB Ectopic Multiple Live Births  0 0 0 0 2    # Outcome Date GA Lbr Len/2nd Weight Sex Delivery Anes PTL Lv  3 Current           2 Term 08/15/11 4060w6d 14:33 / 04:14 6 lb 15 oz (3.147 kg) M VBAC EPI  LIV  1 Term 2011     CS-LTranv        Past Surgical History:  Procedure Laterality Date  . CESAREAN SECTION  2011   LTCS  2-layer closure  . TONSILLECTOMY     Social History   Social History  . Marital status: Single    Spouse name: N/A  . Number of children: N/A  . Years of education: N/A   Occupational History  . Not on file.   Social History Main Topics  . Smoking status: Current Every Day Smoker    Last attempt to quit: 12/29/2010  . Smokeless tobacco: Never Used  . Alcohol use No  . Drug use: No  . Sexual activity: Not Currently   Other Topics Concern  . Not on file   Social History Narrative  . No  narrative on file   No current facility-administered medications on file prior to encounter.    No current outpatient prescriptions on file prior to encounter.   Allergies  Allergen Reactions  . Amoxicillin Rash    Has patient had a PCN reaction causing immediate rash, facial/tongue/throat swelling, SOB or lightheadedness with hypotension: unknown Has patient had a PCN reaction causing severe rash involving mucus membranes or skin necrosis: No Has patient had a PCN reaction that required hospitalization Yes Has patient had a PCN reaction occurring within the last 10 years: No If all of the above answers are "NO", then may proceed with Cephalosporin use.     I have reviewed the past Medical Hx, Surgical Hx, Social Hx, Allergies and Medications.   REVIEW OF SYSTEMS  A comprehensive ROS was negative except per HPI.    OBJECTIVE Patient Vitals for the past 24 hrs:  BP Temp Temp src Pulse Resp SpO2  05/30/16 0838 112/66 98.1 F (36.7 C) Oral 76 16 100 %    PHYSICAL EXAM Constitutional: Well-developed, well-nourished female in no acute distress.  Cardiovascular: normal rate, rhythm, no murmurs Respiratory: normal rate and effort, CTAB GI: Abd soft, non-tender, non-distended, no guarding,  no rebound tenderness. Pos BS x 4, BS NOT hyperactive. MS: Extremities nontender, no edema, normal ROM Neurologic: Alert and oriented x 4.  GU: Neg CVAT.  Unable to perform hemoccult due to hospital does not carry supplies   LAB RESULTS Results for orders placed or performed during the hospital encounter of 05/30/16 (from the past 24 hour(s))  Pregnancy, urine POC     Status: Abnormal   Collection Time: 05/30/16  8:40 AM  Result Value Ref Range   Preg Test, Ur POSITIVE (A) NEGATIVE    IMAGING No results found.  MAU COURSE Unable to perform hemoccult, none in hospital Unable to get FHT on Doppler BHCG/CBC/ABO TVUS - +IUP with cardiac activity, 10w 1d based on CRL.  Discussed with  patient importance of finding a good credible clinic if she does not desire pregnancy, but otherwise a list of OB/GYN in the area if desires to keep pregnancy. Support given to patient. Discussed birth control options for future planning.   MDM Plan of care reviewed with patient, including labs and tests ordered and medical treatment.   ASSESSMENT 1. Normal pregnancy, first trimester   2. Cramping affecting pregnancy, antepartum   3. Other dietetic gastroenteritis     PLAN Discharge home in stable condition. List of OB/GYN in area given to patient Prenatal vitamins recommended, but patient states she does not desire pregnancy.     Medication List    TAKE these medications   ferrous sulfate 325 (65 FE) MG tablet Take 325 mg by mouth daily with breakfast.        Jen MowElizabeth Mumaw, DO OB Fellow 05/30/2016 9:03 AM

## 2017-04-04 ENCOUNTER — Encounter (HOSPITAL_COMMUNITY): Payer: Self-pay

## 2017-06-16 ENCOUNTER — Emergency Department (HOSPITAL_COMMUNITY)
Admission: EM | Admit: 2017-06-16 | Discharge: 2017-06-16 | Disposition: A | Discharge status: Home / Self Care | Payer: Self-pay | Attending: Emergency Medicine | Admitting: Emergency Medicine

## 2017-06-16 ENCOUNTER — Other Ambulatory Visit: Payer: Self-pay

## 2017-06-16 ENCOUNTER — Encounter (HOSPITAL_COMMUNITY): Payer: Self-pay

## 2017-06-16 DIAGNOSIS — W228XXA Striking against or struck by other objects, initial encounter: Secondary | ICD-10-CM | POA: Insufficient documentation

## 2017-06-16 DIAGNOSIS — S93601A Unspecified sprain of right foot, initial encounter: Secondary | ICD-10-CM | POA: Insufficient documentation

## 2017-06-16 DIAGNOSIS — Z87891 Personal history of nicotine dependence: Secondary | ICD-10-CM | POA: Insufficient documentation

## 2017-06-16 DIAGNOSIS — Y998 Other external cause status: Secondary | ICD-10-CM | POA: Insufficient documentation

## 2017-06-16 DIAGNOSIS — Y929 Unspecified place or not applicable: Secondary | ICD-10-CM | POA: Insufficient documentation

## 2017-06-16 DIAGNOSIS — Y9301 Activity, walking, marching and hiking: Secondary | ICD-10-CM | POA: Insufficient documentation

## 2017-06-16 MED ORDER — IBUPROFEN 800 MG PO TABS: 800.0000 mg | ORAL_TABLET | Freq: Three times a day (TID) | ORAL | 0 refills | Status: DC

## 2017-06-16 NOTE — ED Notes (Signed)
Ortho tech called 

## 2017-06-16 NOTE — ED Triage Notes (Signed)
Patient c/o right foot pain on the bal lof her foot and lateral side. Pain is worse with standing and walking.

## 2017-06-16 NOTE — ED Provider Notes (Signed)
[facility]   CSN: [id number] Arrival date & time: 06/16/17  0754     History   Chief Complaint Chief Complaint  Patient presents with  . Foot Pain    right    HPI Sherri Williams is a 31 y.o. female.  HPI Patient has right lateral foot pain.  She reports it hurts with weightbearing.  Last week with the snow she did use a side of her foot to clear her landing.  She reports on the first day the snow was soft but on second day she did not snow was hard in IC.  The only thing she can think of that might have injured her foot.  Has not been red or swollen in appearance.  But it is quite painful on the outer aspect of her foot when she bears weight.  No other medical history.  It was more comfortable in her boot. Past Medical History:  Diagnosis Date  . Depression     Patient Active Problem List   Diagnosis Date Noted  . Depression 08/15/2011  . 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 Elevated liver function tests 08/15/2011    Past Surgical History:  Procedure Laterality Date  . CESAREAN SECTION  2011   LTCS  2-layer closure  . TONSILLECTOMY      OB History    Gravida Para Term Preterm AB Living   3 2 2  0 0 2   SAB TAB Ectopic Multiple Live Births   0 0 0 0 2       Home Medications    Prior to Admission medications   Medication Sig Start Date End Date Taking? Authorizing Provider  ferrous sulfate 325 (65 FE) MG tablet Take 325 mg by mouth daily with breakfast.    [provider]  ibuprofen (ADVIL,MOTRIN) 800 MG tablet Take 1 tablet (800 mg total) by mouth 3 (three) times daily. 06/16/17   Arby BarrettePfeiffer, Gionni Vaca, MD    Family History Family History  Problem Relation Age of Onset  . Sickle cell trait Mother   . Hypertension Maternal Grandmother   . Diabetes Maternal Grandmother   . Sickle cell trait Brother     Social History Social History   Tobacco Use  . Smoking status: Former Smoker    Last attempt  to quit: 12/29/2010    Years since quitting: 6.4  . Smokeless tobacco: Never Used  Substance Use Topics  . Alcohol use: No  . Drug use: No     Allergies   Amoxicillin   Review of Systems Review of Systems Constitutional: No fever no chills no malaise Skeletal: No other areas of joint swelling redness or pain.  Physical Exam Updated Vital Signs BP 111/73 (BP Location: Right Arm)   Pulse 67   Temp 98.6 F (37 C) (Oral)   Resp 15   Ht 5\' 5"  (1.651 m)   Wt 90.7 kg (200 lb)   LMP 05/17/2017   SpO2 97%   BMI 33.28 kg/m   Physical Exam  Constitutional: She is oriented to person, place, and time. She appears well-developed and well-nourished. No distress.  HENT:  Head: Normocephalic and atraumatic.  Pulmonary/Chest: Effort normal.  Musculoskeletal: Normal range of motion. She exhibits tenderness. She exhibits no edema.  Both feet are examined.  Feet are symmetric in appearance.  No objective swelling to visual inspection of the right foot.  No redness.  No wounds.  No deformities.  She does have tenderness to palpation over  the fourth and fifth metatarsals.  This is predominantly on the dorsal surface of the foot.  Plantar surface minimally tender.  Heel is nontender.  Neurological: She is alert and oriented to person, place, and time. No cranial nerve deficit. She exhibits normal muscle tone. Coordination normal.  Skin: Skin is warm and dry.  Psychiatric: She has a normal mood and affect.     ED Treatments / Results  Labs (all labs ordered are listed, but only abnormal results are displayed) Labs Reviewed - No data to display  EKG  EKG Interpretation None       Radiology No results found.  Procedures Procedures (including critical care time)  Medications Ordered in ED Medications - No data to display   Initial Impression / Assessment and Plan / ED Course  I have reviewed the triage vital signs and the nursing notes.  Pertinent labs & imaging results that  were available during my care of the patient were reviewed by me and considered in my medical decision making (see chart for details).      Final Clinical Impressions(s) / ED Diagnoses   Final diagnoses:  Sprain of right foot, initial encounter  Suspect sprain or possibly stress fracture to fourth or fifth metatarsal.  As described, foot exam is normal except reproducible pain to palpation.  Discussed x-ray with the patient.  Advised at this time would not change her management, plan is for Cam walker and follow-up.  Patient agreed at this time she does not wish to proceed with x-rays.  Advised for ibuprofen for next several days, Cam walker and follow-up with podiatry.  ED Discharge Orders        Ordered    ibuprofen (ADVIL,MOTRIN) 800 MG tablet  3 times daily     06/16/17 1140       Arby BarrettePfeiffer, Tziporah Knoke, MD 06/16/17 1149

## 2017-09-23 DIAGNOSIS — Z029 Encounter for administrative examinations, unspecified: Secondary | ICD-10-CM | POA: Insufficient documentation

## 2018-01-13 ENCOUNTER — Emergency Department (HOSPITAL_COMMUNITY)
Admission: EM | Admit: 2018-01-13 | Discharge: 2018-01-13 | Disposition: A | Discharge status: Home / Self Care | Payer: Medicaid Other | Attending: Emergency Medicine | Admitting: Emergency Medicine

## 2018-01-13 ENCOUNTER — Encounter (HOSPITAL_COMMUNITY): Payer: Self-pay | Admitting: Emergency Medicine

## 2018-01-13 DIAGNOSIS — R600 Localized edema: Secondary | ICD-10-CM | POA: Insufficient documentation

## 2018-01-13 DIAGNOSIS — T7840XA Allergy, unspecified, initial encounter: Secondary | ICD-10-CM | POA: Diagnosis not present

## 2018-01-13 DIAGNOSIS — M7989 Other specified soft tissue disorders: Secondary | ICD-10-CM

## 2018-01-13 DIAGNOSIS — Z87891 Personal history of nicotine dependence: Secondary | ICD-10-CM | POA: Diagnosis not present

## 2018-01-13 MED ORDER — PREDNISONE 20 MG PO TABS: 40.0000 mg | ORAL_TABLET | Freq: Every day | ORAL | 0 refills | Status: DC

## 2018-01-13 MED ORDER — PREDNISONE 20 MG PO TABS
60.0000 mg | ORAL_TABLET | ORAL | Status: AC
  Administered 2018-01-13: 60 mg via ORAL
  Filled 2018-01-13: qty 3

## 2018-01-13 NOTE — ED Triage Notes (Signed)
Patient here from home with complaints of bilateral hand redness and swelling x 2 days. States that she is only allergic to amoxicillin and does not know the cause.

## 2018-01-13 NOTE — ED Provider Notes (Signed)
[facility]-EM[facility]PT Provider Note   CSN: [id number] Arrival date & time: 01/13/18  0827     History   Chief Complaint Chief Complaint  Patient presents with  . Hand Pain  . Allergic Reaction    HPI Sherri Williams is a 32 y.o. female.  HPI Patient presents with concern of hand swelling. Onset was 2 days ago, since onset swelling is been persistent, both hands symmetrically, with notation of some skin lesions on the proximal lateral right hand, that she notes as being typical for her when she has had prior episodes. She has one prior similar episode in the recent past, which she attributed to a new soap. She notes that she has recently used a soap again, denies other recent notable changes from typical behavior, likely exposure. Patient works in [employer] bi[employer], uses keyboard, has no likely environmental new exposure. She also denies new complaints including fever, chills, difficulty swallowing, speaking, breathing. Since onset no clear alleviating or exacerbating factors Past Medical History:  Diagnosis Date  . Depression     Patient Active Problem List   Diagnosis Date Noted  . Depression 08/15/2011  . 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 Elevated liver function tests 08/15/2011    Past Surgical History:  Procedure Laterality Date  . CESAREAN SECTION  2011   LTCS  2-layer closure  . TONSILLECTOMY       OB History    Gravida  3   Para  2   Term  2   Preterm  0   AB  0   Living  2     SAB  0   TAB  0   Ectopic  0   Multiple  0   Live Births  2            Home Medications    Prior to Admission medications   Medication Sig Start Date End Date Taking? Authorizing Provider  predniSONE (DELTASONE) 20 MG tablet Take 2 tablets (40 mg total) by mouth daily with breakfast. For the next four days 01/13/18   Gerhard Munch, MD    Family History Family History  Problem Relation Age of Onset  . Sickle cell trait  Mother   . Hypertension Maternal Grandmother   . Diabetes Maternal Grandmother   . Sickle cell trait Brother     Social History Social History   Tobacco Use  . Smoking status: Former Smoker    Last attempt to quit: 12/29/2010    Years since quitting: 7.0  . Smokeless tobacco: Never Used  Substance Use Topics  . Alcohol use: No  . Drug use: No     Allergies   Amoxicillin   Review of Systems Review of Systems  Constitutional:       Per HPI, otherwise negative  HENT:       Per HPI, otherwise negative  Respiratory:       Per HPI, otherwise negative  Cardiovascular:       Per HPI, otherwise negative  Gastrointestinal: Negative for vomiting.  Endocrine:       Negative aside from HPI  Genitourinary:       Neg aside from HPI   Musculoskeletal:       Per HPI, otherwise negative  Skin: Positive for color change and rash.  Allergic/Immunologic: Negative for immunocompromised state.  Neurological: Negative for syncope.     Physical Exam Updated Vital Signs BP 121/76 (BP Location: Left Arm)   Pulse 77   Temp  98.5 F (36.9 C) (Oral)   Resp 20   SpO2 99%   Physical Exam  Constitutional: She is oriented to person, place, and time. She appears well-developed and well-nourished. No distress.  HENT:  Head: Normocephalic and atraumatic.  Eyes: Conjunctivae and EOM are normal.  Cardiovascular: Normal rate and regular rhythm.  Pulmonary/Chest: Effort normal and breath sounds normal. No stridor. No respiratory distress.  Abdominal: She exhibits no distension.  Musculoskeletal: She exhibits no edema.  Swelling of both hands symmetrically, though the patient moves all digits freely, both wrists freely, without apparent limitation.   Neurological: She is alert and oriented to person, place, and time. No cranial nerve deficit.  Skin: Skin is warm and dry.     Psychiatric: She has a normal mood and affect.  Nursing note and vitals reviewed.    ED Treatments / Results    Labs (all labs ordered are listed, but only abnormal results are displayed) Labs Reviewed - No data to display  EKG None  Radiology No results found.  Procedures Procedures (including critical care time)  Medications Ordered in ED Medications  predniSONE (DELTASONE) tablet 60 mg (has no administration in time range)     Initial Impression / Assessment and Plan / ED Course  I have reviewed the triage vital signs and the nursing notes.  Pertinent labs & imaging results that were available during my care of the patient were reviewed by me and considered in my medical decision making (see chart for details).  Young well-appearing female presents with bilateral hand swelling, trace erythema consistent with possible allergic reaction. Precipitant not clear, though there is some consideration of soap, recently used by the patient. No evidence for anaphylaxis, compromised airway, bacteremia, sepsis. Patient encouraged to journal her experiences if she has a recurrence, started on steroids, will follow up with primary care.  Final Clinical Impressions(s) / ED Diagnoses   Final diagnoses:  Swelling of both hands  Allergic reaction, initial encounter    ED Discharge Orders        Ordered    predniSONE (DELTASONE) 20 MG tablet  Daily with breakfast     01/13/18 0942       Gerhard MunchLockwood, Henlee Donovan, MD 01/13/18 0945

## 2018-01-13 NOTE — Discharge Instructions (Signed)
As discussed, you have been diagnosed with an allergic reaction, likely accounting for the swelling in her hands. Please take medication as directed, return here for concerning changes, otherwise follow-up with your primary care physician.

## 2021-01-14 DIAGNOSIS — U071 COVID-19: Secondary | ICD-10-CM | POA: Diagnosis not present

## 2021-06-06 ENCOUNTER — Other Ambulatory Visit: Payer: Self-pay

## 2021-06-06 ENCOUNTER — Other Ambulatory Visit (HOSPITAL_COMMUNITY)
Admission: RE | Admit: 2021-06-06 | Discharge: 2021-06-06 | Disposition: A | Discharge status: Home / Self Care | Payer: BLUE CROSS/BLUE SHIELD | Source: Ambulatory Visit | Attending: Family | Admitting: Family

## 2021-06-06 ENCOUNTER — Ambulatory Visit (INDEPENDENT_AMBULATORY_CARE_PROVIDER_SITE_OTHER): Payer: BLUE CROSS/BLUE SHIELD | Admitting: Family

## 2021-06-06 ENCOUNTER — Encounter: Payer: Self-pay | Admitting: Family

## 2021-06-06 VITALS — BP 118/70 | HR 70 | Temp 97.8°F | Ht 65.0 in | Wt 180.2 lb

## 2021-06-06 DIAGNOSIS — F17210 Nicotine dependence, cigarettes, uncomplicated: Secondary | ICD-10-CM | POA: Diagnosis not present

## 2021-06-06 DIAGNOSIS — Z23 Encounter for immunization: Secondary | ICD-10-CM

## 2021-06-06 DIAGNOSIS — L700 Acne vulgaris: Secondary | ICD-10-CM | POA: Insufficient documentation

## 2021-06-06 DIAGNOSIS — Z0000 Encounter for general adult medical examination without abnormal findings: Secondary | ICD-10-CM

## 2021-06-06 HISTORY — DX: Acne vulgaris: L70.0

## 2021-06-06 HISTORY — DX: Encounter for general adult medical examination without abnormal findings: Z00.00

## 2021-06-06 HISTORY — DX: Encounter for immunization: Z23

## 2021-06-06 LAB — LIPID PANEL
Cholesterol: 113 mg/dL (ref 0–200)
HDL: 47.1 mg/dL (ref >39.00)
LDL Cholesterol: 54 mg/dL (ref 0–99)
NonHDL: 66.29
Total CHOL/HDL Ratio: 2
Triglycerides: 60 mg/dL (ref 0.0–149.0)
VLDL: 12 mg/dL (ref 0.0–40.0)

## 2021-06-06 LAB — CBC WITH DIFFERENTIAL/PLATELET
Basophils Absolute: 0 10*3/uL (ref 0.0–0.1)
Basophils Relative: 0.7 % (ref 0.0–3.0)
Eosinophils Absolute: 0.1 10*3/uL (ref 0.0–0.7)
Eosinophils Relative: 1.9 % (ref 0.0–5.0)
HCT: 33.7 % — ABNORMAL LOW (ref 36.0–46.0)
Hemoglobin: 10.9 g/dL — ABNORMAL LOW (ref 12.0–15.0)
Lymphocytes Relative: 30.8 % (ref 12.0–46.0)
Lymphs Abs: 1.5 10*3/uL (ref 0.7–4.0)
MCHC: 32.3 g/dL (ref 30.0–36.0)
MCV: 80.7 fl (ref 78.0–100.0)
Monocytes Absolute: 0.4 10*3/uL (ref 0.1–1.0)
Monocytes Relative: 8.9 % (ref 3.0–12.0)
Neutro Abs: 2.7 10*3/uL (ref 1.4–7.7)
Neutrophils Relative %: 57.7 % (ref 43.0–77.0)
Platelets: 298 10*3/uL (ref 150.0–400.0)
RBC: 4.17 Mil/uL (ref 3.87–5.11)
RDW: 16 % — ABNORMAL HIGH (ref 11.5–15.5)
WBC: 4.7 10*3/uL (ref 4.0–10.5)

## 2021-06-06 LAB — COMPREHENSIVE METABOLIC PANEL
ALT: 8 U/L (ref 0–35)
AST: 12 U/L (ref 0–37)
Albumin: 4 g/dL (ref 3.5–5.2)
Alkaline Phosphatase: 46 U/L (ref 39–117)
BUN: 11 mg/dL (ref 6–23)
CO2: 29 mEq/L (ref 19–32)
Calcium: 9.5 mg/dL (ref 8.4–10.5)
Chloride: 104 mEq/L (ref 96–112)
Creatinine, Ser: 0.78 mg/dL (ref 0.40–1.20)
GFR: 98.82 mL/min (ref >60.00)
Glucose, Bld: 94 mg/dL (ref 70–99)
Potassium: 3.9 mEq/L (ref 3.5–5.1)
Sodium: 139 mEq/L (ref 135–145)
Total Bilirubin: 0.3 mg/dL (ref 0.2–1.2)
Total Protein: 6.7 g/dL (ref 6.0–8.3)

## 2021-06-06 LAB — HIV ANTIBODY (ROUTINE TESTING W REFLEX): HIV 1&2 Ab, 4th Generation: NONREACTIVE

## 2021-06-06 LAB — TSH: TSH: 1.58 u[IU]/mL (ref 0.35–5.50)

## 2021-06-06 NOTE — Assessment & Plan Note (Signed)
Long term relationship, 2 children, pt works F/T at [employer]. No concerns today other than needing DERM referral. PAP utd, smoker.

## 2021-06-06 NOTE — Assessment & Plan Note (Signed)
Also reports daily THC use.

## 2021-06-06 NOTE — Patient Instructions (Signed)
Welcome to [facility] at [place]! It was a pleasure meeting you today.    PLEASE NOTE:  If you had any LAB tests please let us know if you have not heard back within a few days. You may see your results on MyChart before we have a chance to review them but we will give you a call once they are reviewed by us. If we ordered any REFERRALS today, please let us know if you have not heard from their office within the next week.  Let us know through MyChart if you are needing REFILLS, or have your pharmacy send us the request. You can also use MyChart to communicate with me or any office staff.  Please try these tips to maintain a healthy lifestyle:  Eat most of your calories during the day when you are active. Eliminate processed foods including packaged sweets (pies, cakes, cookies), reduce intake of potatoes, white bread, white pasta, and white rice. Look for whole grain options, oat flour or almond flour.  Each meal should contain half fruits/vegetables, one quarter protein, and one quarter carbs (no bigger than a computer mouse).  Cut down on sweet beverages. This includes juice, soda, and sweet tea. Also watch fruit intake, though this is a healthier sweet option, it still contains natural sugar! Limit to 3 servings daily.  Drink at least 1 glass of water with each meal and aim for at least 8 glasses per day  Exercise at least 150 minutes every week.   

## 2021-06-06 NOTE — Progress Notes (Signed)
Phone [phone]   Subjective:   Patient is a 35 y.o. female presenting for annual physical.    Chief Complaint  Patient presents with   Establish Care   Annual Exam   Referral    Dermatology    See problem oriented charting- ROS- full  review of systems was completed and negative except for: needing DERM referral Acne: Patient presents for evaluation of acne.  Onset was  years ago . Symptoms have gradually worsened. Lesions are described as closed comedones, scars, and ingrown hairs . Acne is primarily located on the cheeks and chin. The patient also describes hirsutism has been noted. 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 For the next four days (Patient not taking: Reported on 06/06/2021) 8 tablet 0   No current facility-administered medications for this visit.    Allergies-reviewed and updated Allergies  Allergen Reactions   Amoxicillin Other (See Comments)    Unsure - mother's throat closes up Has patient had a PCN reaction causing  immediate rash, facial/tongue/throat swelling, SOB or lightheadedness with hypotension: unknown Has patient had a PCN reaction causing severe rash involving mucus membranes or skin necrosis: No Has patient had a PCN reaction that required hospitalization Yes Has patient had a PCN reaction occurring within the last 10 years: No If all of the above answers are "NO", then may proceed with Cephalosporin use.     Social History   Social History Narrative   Not on file   Objective  Objective:  BP 118/70   Pulse 70   Temp 97.8 F (36.6 C) (Temporal)   Ht 5\' 5"  (1.651 m)   Wt 180 lb 3.2 oz (81.7 kg)   LMP 05/20/2021 (Exact Date)   SpO2 98%   BMI 29.99 kg/m  Gen: NAD, resting comfortably HEENT: Mucous membranes are moist. Oropharynx normal Neck: no thyromegaly CV: RRR no murmurs rubs or gallops Lungs: CTAB no crackles, wheeze, rhonchi Abdomen: soft/nontender/nondistended/normal bowel sounds. No rebound or guarding.  Ext: no edema Skin: warm, dry Neuro: grossly normal, moves all extremities, PERRLA   Assessment and Plan   Health Maintenance counseling: 1. Anticipatory guidance: Patient counseled regarding regular dental exams q6 months, eye exams,  avoiding smoking and second hand smoke, limiting alcohol to 1 beverage per day, no illicit drugs.   2. Risk factor reduction:  Advised patient of need for regular exercise and diet rich and fruits and vegetables to reduce risk of heart attack and stroke. Exercise- reports cardio exercise and weights 3d/week.  Wt Readings  from Last 3 Encounters:  06/06/21 180 lb 3.2 oz (81.7 kg)  06/16/17 200 lb (90.7 kg)  04/26/14 179 lb 2 oz (81.3 kg)   3. Immunizations/screenings/ancillary studies Immunization History  Administered Date(s) Administered   Influenza,inj,Quad PF,6+ Mos 06/06/2021   Tdap 08/16/2011   Health Maintenance Due  Topic Date Due   COVID-19 Vaccine (1) Never done   Pneumococcal Vaccine 14-39 Years old (1 - PCV) Never done    Hepatitis C Screening  Never done    4. Cervical cancer screening: 2021 5. Skin cancer screening- advised regular sunscreen use. Denies worrisome, changing, or new skin lesions.  6. Birth control/STD check: none 7. Smoking associated screening:  smoker - 2 packs per week, THC qd  8. Alcohol screening: occasionally  Problem List Items Addressed This Visit       Musculoskeletal and Integument   Acne nodule - Primary   Relevant Orders   Ambulatory referral to Dermatology     Other   Annual physical exam    Long term relationship, 2 children, pt works F/T at [employer]. No concerns today other than needing DERM referral. PAP utd, smoker.      Relevant Orders   Comprehensive metabolic panel   TSH   Lipid panel   CBC with Differential/Platelet   HIV antibody (with reflex)   Urine cytology ancillary only   Need for immunization against influenza   Relevant Orders   Flu Vaccine QUAD 56mo+IM (Fluarix, Fluzone & Alfiuria Quad PF) (Completed)   Cigarette nicotine dependence without complication    Also reports daily THC use.        Recommended follow up: Return for return as needed for any new concerns; annual physical. Future Appointments  Date Time Provider Department Center  07/08/2021  9:15 AM Constant, Gigi Gin, MD [facility] None    Lab/Order associations: fasting   ICD-10-CM   1. Acne nodule  L70.0 Ambulatory referral to Dermatology    2. Annual physical exam  Z00.00 Comprehensive metabolic panel    TSH    Lipid panel    CBC with Differential/Platelet    HIV antibody (with reflex)    Urine cytology ancillary only    3. Need for immunization against influenza  Z23 Flu Vaccine QUAD 34mo+IM (Fluarix, Fluzone & Alfiuria Quad PF)    4. Cigarette nicotine dependence without complication  F17.210         Dulce Sellar, NP

## 2021-06-09 NOTE — Progress Notes (Signed)
Most all of your labs are within normal range.   Just your Hemoglobin is low, also known as mild anemia. If you are having heavy monthly periods, I would take an over the counter iron pill JUST every day of your cycle and for 3-5 days after. Keep up the good work with controlling your diet and continue to try and shoot for 30 minutes of exercise daily!

## 2021-06-10 ENCOUNTER — Other Ambulatory Visit: Payer: Self-pay | Admitting: Family

## 2021-06-10 DIAGNOSIS — A599 Trichomoniasis, unspecified: Secondary | ICD-10-CM

## 2021-06-10 HISTORY — DX: Trichomoniasis, unspecified: A59.9

## 2021-06-10 LAB — URINE CYTOLOGY ANCILLARY ONLY
Bacterial Vaginitis-Urine: POSITIVE — AB
Chlamydia: NEGATIVE
Comment: NEGATIVE
Comment: NEGATIVE
Comment: NORMAL
Neisseria Gonorrhea: NEGATIVE
Trichomonas: POSITIVE — AB

## 2021-06-10 MED ORDER — METRONIDAZOLE 500 MG PO TABS: 500.0000 mg | ORAL_TABLET | Freq: Two times a day (BID) | ORAL | 0 refills | Status: AC

## 2021-06-10 NOTE — Progress Notes (Signed)
Urine came back positive for Trichomonas - I have sent generic Flagyl to her pharmacy, take all pills for 1 week. Do NOT have intercourse until after antibiotic finished and she will want to let her partner or anyone she has had intercourse with over the las 2-3 weeks to get tested. thx

## 2021-06-12 ENCOUNTER — Telehealth: Payer: Self-pay

## 2021-06-12 ENCOUNTER — Ambulatory Visit: Payer: Medicaid Other | Admitting: Family

## 2021-06-12 NOTE — Telephone Encounter (Signed)
Pt was returning a call. Please Advise.  

## 2021-06-13 NOTE — Telephone Encounter (Signed)
Patient returning our call-she already saw her lab results on My Chart-FYI

## 2021-06-13 NOTE — Telephone Encounter (Signed)
See message below °

## 2021-06-13 NOTE — Telephone Encounter (Signed)
Pt saw results, but not message from [place].

## 2021-06-13 NOTE — Telephone Encounter (Signed)
Please see lab result note.

## 2021-07-08 ENCOUNTER — Encounter: Payer: Self-pay | Admitting: Obstetrics and Gynecology

## 2021-07-08 ENCOUNTER — Telehealth: Payer: Self-pay | Admitting: Dermatology

## 2021-07-08 ENCOUNTER — Other Ambulatory Visit: Payer: Self-pay

## 2021-07-08 ENCOUNTER — Other Ambulatory Visit (HOSPITAL_COMMUNITY)
Admission: RE | Admit: 2021-07-08 | Discharge: 2021-07-08 | Disposition: A | Discharge status: Home / Self Care | Payer: BLUE CROSS/BLUE SHIELD | Source: Ambulatory Visit | Attending: Obstetrics and Gynecology | Admitting: Obstetrics and Gynecology

## 2021-07-08 ENCOUNTER — Ambulatory Visit (INDEPENDENT_AMBULATORY_CARE_PROVIDER_SITE_OTHER): Payer: BLUE CROSS/BLUE SHIELD | Admitting: Obstetrics and Gynecology

## 2021-07-08 VITALS — BP 109/72 | HR 84 | Ht 66.0 in | Wt 183.0 lb

## 2021-07-08 DIAGNOSIS — L989 Disorder of the skin and subcutaneous tissue, unspecified: Secondary | ICD-10-CM | POA: Diagnosis not present

## 2021-07-08 DIAGNOSIS — Z01419 Encounter for gynecological examination (general) (routine) without abnormal findings: Secondary | ICD-10-CM | POA: Insufficient documentation

## 2021-07-08 NOTE — Telephone Encounter (Signed)
Referral from [facility]. Please request that they try to get her in elsewhere before July

## 2021-07-08 NOTE — Progress Notes (Signed)
GYN presents for AEX/PAP/STD screening.  C/o skin rashes.

## 2021-07-08 NOTE — Progress Notes (Signed)
Subjective:     Sherri Williams is a 36 y.o. female P2 with LMP 06/17/21  and BMI 29 who is here for a comprehensive physical exam. The patient reports no problems. She is not currently sexually active and is not using any contraception. She reports a monthly period lasting 6-7 days. She denies pelvic pain or abnormal discharge. 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 Patient is otherwise without any other complaints  Past Medical History:  Diagnosis Date   Depression    Past Surgical History:  Procedure Laterality Date   CESAREAN SECTION  2011   LTCS  2-layer closure   TONSILLECTOMY     Family History  Problem Relation Age of Onset   Sickle cell trait Mother    Hypertension Maternal Grandmother    Diabetes Maternal Grandmother    Sickle cell trait Brother     Social History   Socioeconomic History   Marital status: Single    Spouse name: Not on file   Number of children: Not on file   Years of education: Not on file   Highest education level: Not on file  Occupational History   Not on file  Tobacco Use   Smoking status: Former    Types: Cigarettes    Quit date: 12/29/2010    Years since quitting: 10.5   Smokeless tobacco: Never  Vaping Use   Vaping Use: Never used  Substance and Sexual Activity   Alcohol use: No   Drug use: No   Sexual activity: Not Currently  Other Topics Concern   Not on file  Social History Narrative   Not on file   Social Determinants of Health   Financial Resource Strain: Not on file  Food Insecurity: Not on file  Transportation Needs: Not on file  Physical Activity: Not on file  Stress: Not on file  Social Connections: Not on file  Intimate Partner Violence: Not on file   Health Maintenance  Topic Date Due   COVID-19 Vaccine (1) Never done   Pneumococcal Vaccine 51-63 Years old (1 - PCV) Never done   Hepatitis C Screening  Never done   TETANUS/TDAP  08/15/2021   PAP  SMEAR-Modifier  09/29/2022   INFLUENZA VACCINE  Completed   HIV Screening  Completed   HPV VACCINES  Aged Out       Review of Systems Pertinent items noted in HPI and remainder of comprehensive ROS otherwise negative.   Objective:  Blood pressure 109/72, pulse 84, height 5\' 6"  (1.676 m), weight 183 lb (83 kg), last menstrual period 06/17/2021, unknown if currently breastfeeding.    GENERAL: Well-developed, well-nourished female in no acute distress.  HEENT: Normocephalic, atraumatic. Sclerae anicteric.  NECK: Supple. Normal thyroid.  LUNGS: Clear to auscultation bilaterally.  HEART: Regular rate and rhythm. BREASTS: Symmetric in size. No palpable masses or lymphadenopathy, skin changes, or nipple drainage. ABDOMEN: Soft, nontender, nondistended. No organomegaly. PELVIC: Normal external female genitalia. Vagina is pink and rugated.  Normal discharge. Normal appearing cervix. Uterus is normal in size. No adnexal mass or tenderness. Chaperone present during the pelvic exam EXTREMITIES: No cyanosis, clubbing, or edema, 2+ distal pulses.    Assessment:    Healthy female exam.      Plan:    Pap smear collected STI testing per patient request TSH and A1C ordered today (patient is not fasting) Patient will be contacted with abnormal results Referral placed for dermatology See After Visit  Summary for Counseling Recommendations

## 2021-07-08 NOTE — Telephone Encounter (Signed)
This referral was not in my WQ but I documented notes and routed referral back to referring office.

## 2021-07-09 LAB — CERVICOVAGINAL ANCILLARY ONLY
Chlamydia: NEGATIVE
Comment: NEGATIVE
Comment: NORMAL
Neisseria Gonorrhea: NEGATIVE

## 2021-07-09 LAB — CYTOLOGY - PAP
Adequacy: ABSENT
Comment: NEGATIVE
Diagnosis: NEGATIVE
High risk HPV: NEGATIVE

## 2021-07-09 LAB — CBC
Hematocrit: 34.4 % (ref 34.0–46.6)
Hemoglobin: 10.8 g/dL — ABNORMAL LOW (ref 11.1–15.9)
MCH: 26.4 pg — ABNORMAL LOW (ref 26.6–33.0)
MCHC: 31.4 g/dL — ABNORMAL LOW (ref 31.5–35.7)
MCV: 84 fL (ref 79–97)
Platelets: 280 10*3/uL (ref 150–450)
RBC: 4.09 x10E6/uL (ref 3.77–5.28)
RDW: 14.4 % (ref 11.7–15.4)
WBC: 3.8 10*3/uL (ref 3.4–10.8)

## 2021-07-09 LAB — HIV ANTIBODY (ROUTINE TESTING W REFLEX): HIV Screen 4th Generation wRfx: NONREACTIVE

## 2021-07-09 LAB — HEPATITIS C ANTIBODY: Hep C Virus Ab: <0.1 s/co ratio (ref 0.0–0.9)

## 2021-07-09 LAB — RPR: RPR Ser Ql: NONREACTIVE

## 2021-07-09 LAB — TSH: TSH: 1.33 u[IU]/mL (ref 0.450–4.500)

## 2021-07-09 LAB — HEMOGLOBIN A1C
Est. average glucose Bld gHb Est-mCnc: 103 mg/dL
Hgb A1c MFr Bld: 5.2 % (ref 4.8–5.6)

## 2022-02-24 ENCOUNTER — Ambulatory Visit: Payer: Medicaid Other | Admitting: Family

## 2022-02-26 ENCOUNTER — Ambulatory Visit: Payer: Medicaid Other | Admitting: Family

## 2022-03-24 ENCOUNTER — Encounter: Payer: Self-pay | Admitting: *Deleted

## 2022-05-30 ENCOUNTER — Encounter: Payer: Self-pay | Admitting: Family

## 2022-05-30 ENCOUNTER — Ambulatory Visit: Payer: Medicaid Other | Admitting: Family

## 2022-05-30 VITALS — BP 114/79 | HR 60 | Temp 97.6°F | Ht 66.0 in | Wt 184.0 lb

## 2022-05-30 DIAGNOSIS — F332 Major depressive disorder, recurrent severe without psychotic features: Secondary | ICD-10-CM | POA: Diagnosis not present

## 2022-05-30 MED ORDER — VENLAFAXINE HCL ER 37.5 MG PO CP24: 37.5000 mg | ORAL_CAPSULE | Freq: Every day | ORAL | 0 refills | Status: DC

## 2022-05-30 NOTE — Progress Notes (Signed)
Patient ID: Sherri Williams, female    DOB: 12-Apr-1986, 36 y.o.   MRN: [id number]  Chief Complaint  Patient presents with   Mood    Anxiety/Depression, Pt states she was on medication in the past. Anxiety/depression has been getting worse and affecting pt's daily routines.    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 Has taken Celexa in past (not exactly sure of name) and this helped but stopped taking after she felt better.  Also reports doing online therapy thru her work in past which was somewhat helpful, but she prefers to do some sessions in person. pt reports feeling like a failure, raising her children alone, feels she should be doing better in her life.    Assessment & Plan:   Problem List Items Addressed This Visit       Other   Depression - Primary    chronic - had been in remission, now having new severe episode denies any suicidal thoughts or ideation today crisis # provided and local mental health clinic address given if emergent need reports taking Celexa (she thinks) in past, counseling thru work online, would prefer in-person discussed different meds and sending Effexor 37.5mg  qd, advised on use & SE sending [facility] referral f/u in 1 month - advised to call sooner if any concerns      Relevant Medications   venlafaxine XR (EFFEXOR XR) 37.5 MG 24 hr capsule   Other Relevant Orders   Ambulatory referral to Psychiatry    Subjective:    Outpatient Medications Prior to Visit  Medication Sig Dispense Refill   predniSONE (DELTASONE) 20 MG tablet Take 2 tablets (40 mg total) by mouth daily with breakfast. For the next four days 8 tablet 0   No facility-administered medications prior to visit.   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   Trichomonas infection 06/10/2021   Past Surgical History:  Procedure Laterality Date   CESAREAN SECTION  2011   LTCS  2-layer closure   TONSILLECTOMY     Allergies  Allergen Reactions   Amoxicillin Other (See Comments)    Unsure - mother's throat closes up Has patient had a PCN reaction causing immediate rash, facial/tongue/throat swelling, SOB or lightheadedness with hypotension: unknown Has patient had a PCN reaction causing severe rash involving mucus membranes or skin necrosis: No Has patient had a PCN reaction that required hospitalization Yes Has patient had a PCN reaction occurring within the last 10 years: No If all of the above answers are "NO", then may proceed with Cephalosporin use.       Objective:    Physical Exam Vitals and nursing note reviewed.  Constitutional:      Appearance: Normal appearance.  Cardiovascular:     Rate and Rhythm: Normal rate and regular rhythm.  Pulmonary:     Effort: Pulmonary effort is normal.     Breath sounds: Normal breath sounds.  Musculoskeletal:        General: Normal range of motion.  Skin:    General: Skin is warm and dry.  Neurological:     Mental Status: She is alert.  Psychiatric:        Mood and Affect:  Mood is depressed. Affect is flat and tearful.        Behavior: Behavior normal.    BP 114/79 (BP Location: Left Arm, Patient Position: Sitting, Cuff Size: Large)   Pulse 60   Temp 97.6 F (36.4 C) (Temporal)   Ht 5\' 6"  (1.676 m)   Wt 184 lb (83.5 kg)   LMP 05/12/2022 (Exact Date)   SpO2 98%   Breastfeeding No   BMI 29.70 kg/m  Wt Readings from Last 3 Encounters:  05/30/22 184 lb (83.5 kg)  07/08/21 183 lb (83 kg)  06/06/21 180 lb 3.2 oz (81.7 kg)       Jeanie Sewer, NP

## 2022-05-30 NOTE — Patient Instructions (Addendum)
It was very nice to see you today!   Start the generic Effexor today that I sent to your pharmacy.  This may take 2-3 weeks to really start working. Very important to also start seeing a therapist again - I have sent a referral and they will call you directly, but it can take a week or 2 to get an appointment.  **If you have ANY thoughts of suicide you need to dial [phone] to speak to someone immediately!  This is the 911 number for mental health crises and they are ready to help you.  **You can also go to our outpatient mental health counseling center (without an appointment) located at [address] here in [place] at any time or call [phone] and speak to someone.       PLEASE NOTE:  If you had any lab tests please let us know if you have not heard back within a few days. You may see your results on MyChart before we have a chance to review them but we will give you a call once they are reviewed by [place]. If we ordered any referrals today, please let us know if you have not heard from their office within the next week.

## 2022-05-30 NOTE — Assessment & Plan Note (Addendum)
chronic - had been in remission, now having new severe episode denies any suicidal thoughts or ideation today crisis # provided and local mental health clinic address given if emergent need reports taking Celexa (she thinks) in past, counseling thru work online, would prefer in-person discussed different meds and sending Effexor 37.5mg  qd, advised on use & SE sending [facility] referral f/u in 1 month - advised to call sooner if any concerns

## 2022-06-01 ENCOUNTER — Encounter: Payer: Self-pay | Admitting: Family

## 2022-06-02 ENCOUNTER — Other Ambulatory Visit: Payer: Self-pay | Admitting: Family

## 2022-06-02 ENCOUNTER — Telehealth: Payer: Self-pay

## 2022-06-02 DIAGNOSIS — R110 Nausea: Secondary | ICD-10-CM

## 2022-06-02 MED ORDER — ONDANSETRON HCL 4 MG PO TABS: 4.0000 mg | ORAL_TABLET | Freq: Three times a day (TID) | ORAL | 0 refills | Status: DC | PRN

## 2022-06-02 NOTE — Telephone Encounter (Signed)
I called pt and let her know per Judeth Cornfield- She will send in Zofran for Nausea and advised pt to eat then take medication 30 minutes after. Pt gave a verbalized understanding. Also pt states she will need December 4 though December 8th off to give medication time to kick in. I let pt know FMLA papers will be faxed and scanned into her chart.

## 2022-06-02 NOTE — Telephone Encounter (Signed)
I called pt and LVM  in regards to FMLA paperwork to see if she needed any days off this week until medication kicks in.

## 2022-06-03 ENCOUNTER — Encounter: Payer: Self-pay | Admitting: Family

## 2022-06-21 ENCOUNTER — Other Ambulatory Visit: Payer: Self-pay | Admitting: Family

## 2022-06-21 DIAGNOSIS — F332 Major depressive disorder, recurrent severe without psychotic features: Secondary | ICD-10-CM

## 2022-10-23 ENCOUNTER — Ambulatory Visit (INDEPENDENT_AMBULATORY_CARE_PROVIDER_SITE_OTHER): Payer: BLUE CROSS/BLUE SHIELD | Admitting: Family

## 2022-10-23 VITALS — BP 118/68 | HR 73 | Temp 98.0°F | Resp 16 | Ht 66.0 in | Wt 196.6 lb

## 2022-10-23 DIAGNOSIS — F332 Major depressive disorder, recurrent severe without psychotic features: Secondary | ICD-10-CM

## 2022-10-23 DIAGNOSIS — F321 Major depressive disorder, single episode, moderate: Secondary | ICD-10-CM

## 2022-10-23 MED ORDER — SERTRALINE HCL 25 MG PO TABS: 25.0000 mg | ORAL_TABLET | Freq: Every day | ORAL | 1 refills | Status: DC

## 2022-10-23 NOTE — Patient Instructions (Signed)
It was very nice to see you today!   When       PLEASE NOTE:  If you had any lab tests please let us know if you have not heard back within a few days. You may see your results on MyChart before we have a chance to review them but we will give you a call once they are reviewed by [place]. If we ordered any referrals today, please let us know if you have not heard from their office within the next week.

## 2022-10-23 NOTE — Progress Notes (Signed)
Patient ID: Sherri Williams, female    DOB: 1985/08/02, 37 y.o.   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 Has taken Celexa in past (not exactly sure of name) and this helped but stopped taking after she felt better.  Also reports doing online therapy thru her work in past which was somewhat helpful, but she prefers to do some sessions in person. pt reports feeling like a failure, raising her children alone, feels she should be doing better in her life. *Update today - given Effexor 37.5mg , pt states it made her depression worse, took for 1 month, also read on social media about other people having bad side effects from med, so she stopped taking. Needing FMLA paperwork updated today.   Assessment & Plan:   Problem List Items Addressed This Visit     Depression - Primary    chronic, still unstable, first seen for problem 4 months ago denies any suicidal thoughts or ideation today took Effexor 37.5mg  for about 1 mo but reports it made her feel worse, then heard bad things about the med and stopped taking sent referral to Psych, but pt did not return calls to schedule continue to encourage getting therapy pt requesting new FMLA paperwork, wants # of days needed increased to 3 per month starting generic Zoloft 25mg  qd, advised on use & SE, can take qhs if drowsy f/u in 1 month - advised to call sooner if any concerns      Relevant Medications   sertraline (ZOLOFT) 25 MG tablet    Subjective:    Outpatient Medications Prior to Visit  Medication Sig Dispense Refill   ondansetron (ZOFRAN) 4 MG tablet Take 1 tablet (4 mg total) by mouth every 8 (eight)  hours as needed for nausea or vomiting. 20 tablet 0   venlafaxine XR (EFFEXOR-XR) 37.5 MG 24 hr capsule TAKE 1 CAPSULE BY MOUTH DAILY WITH BREAKFAST. 90 capsule 1   No facility-administered medications prior to visit.   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   Trichomonas infection 06/10/2021   Past Surgical History:  Procedure Laterality Date   CESAREAN SECTION  2011   LTCS  2-layer closure   TONSILLECTOMY     Allergies  Allergen Reactions   Amoxicillin Other (See Comments)    Unsure - mother's throat closes up Has patient had a PCN reaction causing immediate rash, facial/tongue/throat swelling, SOB or lightheadedness with hypotension: unknown Has patient had a PCN reaction causing severe rash involving mucus membranes or skin necrosis: No Has patient had a PCN reaction that required hospitalization Yes Has patient had a PCN reaction occurring within the last 10 years: No If all of the above answers are "NO", then may proceed with Cephalosporin use.       Objective:    Physical Exam Vitals and nursing note reviewed.  Constitutional:      Appearance: Normal appearance.  Cardiovascular:  Rate and Rhythm: Normal rate and regular rhythm.  Pulmonary:     Effort: Pulmonary effort is normal.     Breath sounds: Normal breath sounds.  Musculoskeletal:        General: Normal range of motion.  Skin:    General: Skin is warm and dry.  Neurological:     Mental Status: She is alert.  Psychiatric:        Mood and Affect: Mood is depressed. Affect is flat.        Behavior: Behavior normal.    BP 118/68   Pulse 73   Temp 98 F (36.7 C) (Temporal)   Resp 16   Ht 5\' 6"  (1.676 m)   Wt 196 lb 9.6 oz (89.2 kg)   SpO2 98%   BMI 31.73 kg/m  Wt Readings from Last 3  Encounters:  10/23/22 196 lb 9.6 oz (89.2 kg)  05/30/22 184 lb (83.5 kg)  07/08/21 183 lb (83 kg)       Dulce Sellar, NP

## 2022-10-25 ENCOUNTER — Encounter: Payer: Self-pay | Admitting: Family

## 2022-10-25 NOTE — Assessment & Plan Note (Addendum)
chronic, still unstable, first seen for problem 4 months ago denies any suicidal thoughts or ideation today took Effexor 37.5mg  for about 1 mo but reports it made her feel worse, then heard bad things about the med and stopped taking sent referral to Psych, but pt did not return calls to schedule continue to encourage getting therapy pt requesting new FMLA paperwork, wants # of days needed increased to 3 per month starting generic Zoloft 25mg  qd, advised on use & SE, can take qhs if drowsy f/u in 1 month - advised to call sooner if any concerns

## 2022-10-29 ENCOUNTER — Ambulatory Visit: Payer: BLUE CROSS/BLUE SHIELD | Admitting: Family

## 2022-11-06 ENCOUNTER — Encounter: Payer: Self-pay | Admitting: Family

## 2022-11-06 ENCOUNTER — Telehealth (INDEPENDENT_AMBULATORY_CARE_PROVIDER_SITE_OTHER): Payer: BLUE CROSS/BLUE SHIELD | Admitting: Family

## 2022-11-06 VITALS — Ht 66.0 in | Wt 190.0 lb

## 2022-11-06 DIAGNOSIS — F332 Major depressive disorder, recurrent severe without psychotic features: Secondary | ICD-10-CM | POA: Diagnosis not present

## 2022-11-06 NOTE — Assessment & Plan Note (Signed)
chronic, still unstable, first seen for problem 4 1/2 months ago started Zoloft 25mg  qd 3 weeks ago, she reports no change in symptoms & caused drowsiness so she switched to qhs and not drowsy during day but reports having very vivid dreams, not nightmares, but sometimes unsettling. advised pt (that she looks better & actually has a little smile today) to continue same dose for another week, then increase dose to 50mg  & if still no improvement in sx, ok to stop, but if doing better let the office know so we can send more pills. f/u in 1-2 month - advised to call sooner if any concerns

## 2022-11-06 NOTE — Progress Notes (Signed)
MyChart Video Visit    Virtual Visit via Video Note   This format is felt to be most appropriate for this patient at this time. Physical exam was limited by quality of the video and audio technology used for the visit. CMA was able to get the patient set up on a video visit.  Patient location: Home. Patient and provider in visit Provider location: Office  I discussed the limitations of evaluation and management by telemedicine and the availability of in person appointments. The patient expressed understanding and agreed to proceed.  Visit Date: 11/06/2022  Today's healthcare provider: Dulce Sellar, NP     Subjective:   Patient ID: Sherri Williams, female    DOB: [birth year], 37 y.o.   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 Has taken Celexa in past (not exactly sure of name) and this helped but stopped taking after she felt better.  Also reports doing online therapy thru her work in past which was somewhat helpful, but she prefers to do some sessions in person. pt reports feeling like a failure, raising her children alone, feels she should be doing better in her life. *Update today - given Effexor 37.5mg , pt states it made her depression worse, took for 1 month, also read on social media about other people having bad side effects from med, so she stopped taking. *Last visit FMLA updated to 3 days off d/t sx, pt started Zoloft 25mg  3 weeks ago, taking qhs, causing vivid dreams, no change in depression yet.   Assessment & Plan:  Severe episode of recurrent major depressive disorder, without psychotic features (HCC) Assessment & Plan: chronic, still unstable, first seen for problem 4 1/2 months ago started Zoloft 25mg  qd 3 weeks ago, she reports no change in  symptoms & caused drowsiness so she switched to qhs and not drowsy during day but reports having very vivid dreams, not nightmares, but sometimes unsettling. advised pt (that she looks better & actually has a little smile today) to continue same dose for another week, then increase dose to 50mg  & if still no improvement in sx, ok to stop, but if doing better let the office know so we can send more pills. 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 30 tablet 1   ondansetron (ZOFRAN) 4 MG tablet Take 1 tablet (4 mg total) by mouth every 8 (eight) hours as needed for nausea or vomiting. (Patient not taking: Reported on 11/06/2022) 20 tablet 0   No facility-administered medications prior to visit.    Allergies  Allergen Reactions   Amoxicillin Other (See Comments)    Unsure - mother's throat  closes up Has patient had a PCN reaction causing immediate rash, facial/tongue/throat swelling, SOB or lightheadedness with hypotension: unknown Has patient had a PCN reaction causing severe rash involving mucus membranes or skin necrosis: No Has patient had a PCN reaction that required hospitalization Yes Has patient had a PCN reaction occurring within the last 10 years: No If all of the above answers are "NO", then may proceed with Cephalosporin use.        Objective:   Physical Exam Vitals and nursing note reviewed.   Constitutional:      General: Pt is not in acute distress.    Appearance: Normal appearance.  HENT:     Head: Normocephalic.  Pulmonary:     Effort: No respiratory distress.  Musculoskeletal:     Cervical back: Normal range of motion.  Skin:    General: Skin is dry.     Coloration: Skin is not pale.  Neurological:     Mental Status: Pt is alert and oriented to person, place, and time.  Psychiatric:        Mood and Affect: Mood normal.   Ht 5\' 6"  (1.676 m)   Wt 190 lb (86.2 kg)   LMP 10/20/2022 (Approximate)   BMI 30.67 kg/m   Wt Readings from Last 3 Encounters:  11/06/22 190 lb (86.2 kg)  10/23/22 196 lb 9.6 oz (89.2 kg)  05/30/22 184 lb (83.5 kg)      I discussed the assessment and treatment plan with the patient. The patient was provided an opportunity to ask questions and all were answered. The patient agreed with the plan and demonstrated an understanding of the instructions.   The patient was advised to call back or seek an in-person evaluation if the symptoms worsen or if the condition fails to improve as anticipated.  Dulce Sellar, NP [facility] [place] [phone] (phone) [phone] (fax)  [facility]

## 2023-03-03 ENCOUNTER — Encounter (HOSPITAL_BASED_OUTPATIENT_CLINIC_OR_DEPARTMENT_OTHER): Payer: Self-pay

## 2023-03-03 ENCOUNTER — Emergency Department (HOSPITAL_BASED_OUTPATIENT_CLINIC_OR_DEPARTMENT_OTHER)
Admission: EM | Admit: 2023-03-03 | Discharge: 2023-03-03 | Disposition: A | Discharge status: Home / Self Care | Payer: BLUE CROSS/BLUE SHIELD | Attending: Emergency Medicine | Admitting: Emergency Medicine

## 2023-03-03 ENCOUNTER — Other Ambulatory Visit: Payer: Self-pay

## 2023-03-03 DIAGNOSIS — Z1152 Encounter for screening for COVID-19: Secondary | ICD-10-CM | POA: Diagnosis not present

## 2023-03-03 DIAGNOSIS — H6690 Otitis media, unspecified, unspecified ear: Secondary | ICD-10-CM

## 2023-03-03 DIAGNOSIS — H6693 Otitis media, unspecified, bilateral: Secondary | ICD-10-CM | POA: Insufficient documentation

## 2023-03-03 DIAGNOSIS — H9202 Otalgia, left ear: Secondary | ICD-10-CM | POA: Diagnosis present

## 2023-03-03 LAB — RESP PANEL BY RT-PCR (RSV, FLU A&B, COVID)  RVPGX2
Influenza A by PCR: NEGATIVE
Influenza B by PCR: NEGATIVE
Resp Syncytial Virus by PCR: NEGATIVE
SARS Coronavirus 2 by RT PCR: NEGATIVE

## 2023-03-03 MED ORDER — AZITHROMYCIN 250 MG PO TABS: 250.0000 mg | ORAL_TABLET | Freq: Every day | ORAL | 0 refills | Status: DC

## 2023-03-03 NOTE — ED Triage Notes (Signed)
Pt reports cough and congestion starting on Friday with some headaches. Has been using decongestants at home with relief.

## 2023-03-03 NOTE — ED Provider Notes (Signed)
[place] EMERGENCY DEPARTMENT AT [facility] Provider Note   CSN: [id number] Arrival date & time: 03/03/23  1402     History  Chief Complaint  Patient presents with   Nasal Congestion    Sherri Williams is a 37 y.o. female history of depression presented with 4 days of sinus pressure and bilateral ear pain.  Patient notes that originally she was having sinus pressure and thought this was her regular sinusitis that she gets at this time of the year but notes that after the first day began having ear discomfort and having increased fatigue and chills.  Patient endorses subjective fever at home.  Patient denies any neck pain, chest pain, shortness of breath, facial or neck swelling, altered mental status, neck stiffness, vision changes, change in sensation of smell skills, new onset weakness.  Patient has tried decongestants at home which have helped but states that overall her symptoms have persisted.  Patient denies any hearing changes.  Home Medications Prior to Admission medications   Medication Sig Start Date End Date Taking? Authorizing Provider  azithromycin (ZITHROMAX) 250 MG tablet Take 1 tablet (250 mg total) by mouth daily. Take first 2 tablets together, then 1 every day until finished. 03/03/23   Netta Corrigan, PA-C  sertraline (ZOLOFT) 25 MG tablet Take 1 tablet (25 mg total) by mouth daily. 10/23/22   Dulce Sellar, NP      Allergies    Amoxicillin    Review of Systems   Review of Systems  Physical Exam Updated Vital Signs BP 114/72 (BP Location: Right Arm)   Pulse 74   Temp 97.7 F (36.5 C)   Resp 16   Ht 5\' 6"  (1.676 m)   Wt 90.7 kg   LMP 02/09/2023   SpO2 100%   BMI 32.28 kg/m  Physical Exam Constitutional:      General: She is not in acute distress.    Comments: Resting comfortably on phone  HENT:     Head: Normocephalic.     Comments: No sinus tenderness to palpation    Ears:     Comments: Bilateral bulging erythematous tympanic  membranes without perforation No mastoid tenderness or skin color changes or area of fluctuance    Nose: Congestion present.     Mouth/Throat:     Mouth: Mucous membranes are moist.     Comments: No PTA No exudates No muffled voice Tolerating secretions Eyes:     Extraocular Movements: Extraocular movements intact.     Conjunctiva/sclera: Conjunctivae normal.     Pupils: Pupils are equal, round, and reactive to light.  Cardiovascular:     Rate and Rhythm: Normal rate and regular rhythm.     Pulses: Normal pulses.     Heart sounds: Normal heart sounds.  Pulmonary:     Effort: Pulmonary effort is normal. No respiratory distress.     Breath sounds: Normal breath sounds.  Abdominal:     General: Abdomen is flat.     Palpations: Abdomen is soft.     Tenderness: There is no abdominal tenderness. There is no guarding.  Musculoskeletal:        General: Normal range of motion.     Cervical back: Normal range of motion. No rigidity.  Lymphadenopathy:     Cervical: No cervical adenopathy.  Skin:    General: Skin is warm.     Capillary Refill: Capillary refill takes less than 2 seconds.  Neurological:     Mental Status: She is alert and  oriented to person, place, and time.  Psychiatric:        Mood and Affect: Mood normal.     ED Results / Procedures / Treatments   Labs (all labs ordered are listed, but only abnormal results are displayed) Labs Reviewed  RESP PANEL BY RT-PCR (RSV, FLU A&B, COVID)  [id number]    EKG None  Radiology No results found.  Procedures Procedures    Medications Ordered in ED Medications - No data to display  ED Course/ Medical Decision Making/ A&P                                 Medical Decision Making  Sherri Williams 37 y.o. presented today for URI like symptoms. Working DDx that I considered at this time includes, but not limited to, viral illness, pharyngitis, mono, sinusitis, electrolyte abnormality, AOM.  R/o DDx: viral illness,  pharyngitis, mono, sinusitis, electrolyte abnormality: these diagnoses are not consistent with patient's history, presentation, physical exam, labs/imaging findings.  Review of prior external notes: 11/14/2022 unknown  Unique Tests and My Interpretation:  Respiratory panel: Negative  Discussion with Independent Historian: None  Discussion of Management of Tests: None  Risk: Medium: prescription drug management  Risk Stratification Score: None  Plan: On exam patient was no acute distress with stable vitals.  On exam patient did have bilateral erythematous and bulging tympanic membranes however had a reassuring physical exam outside of this.  I offered to order a strep test as well as patient states she was having a mild sore throat however patient states that her symptoms are more in her ears than her throat and that she does not believe she has strep and so this lab will not be ordered at this time.  Patient does have penicillin allergy which appears she has had hospitalization for the past in the chart and 1 open spoke to the patient said she gets a rash.  Due to this we will give azithromycin and encourage primary care follow-up.  Encouraged patient to use Tylenol every 6 hours as needed for pain remain hydrated.  Patient given work note.  Patient was given return precautions.patient stable for discharge at this time.  Patient verbalized understanding of plan.         Final Clinical Impression(s) / ED Diagnoses Final diagnoses:  Acute otitis media, unspecified otitis media type    Rx / DC Orders ED Discharge Orders          Ordered    azithromycin (ZITHROMAX) 250 MG tablet  Daily,   Status:  Discontinued        03/03/23 1912    azithromycin (ZITHROMAX) 250 MG tablet  Daily        03/03/23 1912              Netta Corrigan, PA-C 03/03/23 1920    Theresia Lo, [place] K, DO 03/03/23 2338

## 2023-03-03 NOTE — Discharge Instructions (Signed)
Please follow-up with your primary care provider regarding recent symptoms and ER visit.  Today your physical exam shows that you have an ear infection both your ears and I have prescribed you an antibiotic to take for this.  You may take Tylenol every 6 hours needed for pain and please remain hydrated.  If symptoms change or worsen please return to ER.

## 2023-03-05 ENCOUNTER — Other Ambulatory Visit: Payer: Self-pay

## 2023-03-05 ENCOUNTER — Other Ambulatory Visit (HOSPITAL_BASED_OUTPATIENT_CLINIC_OR_DEPARTMENT_OTHER): Payer: Self-pay

## 2023-03-05 ENCOUNTER — Emergency Department (HOSPITAL_BASED_OUTPATIENT_CLINIC_OR_DEPARTMENT_OTHER)
Admission: EM | Admit: 2023-03-05 | Discharge: 2023-03-05 | Disposition: A | Discharge status: Home / Self Care | Payer: BLUE CROSS/BLUE SHIELD | Attending: Emergency Medicine | Admitting: Emergency Medicine

## 2023-03-05 ENCOUNTER — Encounter (INDEPENDENT_AMBULATORY_CARE_PROVIDER_SITE_OTHER): Payer: Self-pay | Admitting: Otolaryngology

## 2023-03-05 ENCOUNTER — Encounter (HOSPITAL_BASED_OUTPATIENT_CLINIC_OR_DEPARTMENT_OTHER): Payer: Self-pay | Admitting: Emergency Medicine

## 2023-03-05 ENCOUNTER — Ambulatory Visit (INDEPENDENT_AMBULATORY_CARE_PROVIDER_SITE_OTHER): Payer: BLUE CROSS/BLUE SHIELD | Admitting: Otolaryngology

## 2023-03-05 VITALS — BP 112/74 | HR 85 | Ht 66.0 in | Wt 207.0 lb

## 2023-03-05 DIAGNOSIS — H6691 Otitis media, unspecified, right ear: Secondary | ICD-10-CM | POA: Insufficient documentation

## 2023-03-05 DIAGNOSIS — H6121 Impacted cerumen, right ear: Secondary | ICD-10-CM | POA: Insufficient documentation

## 2023-03-05 DIAGNOSIS — H7291 Unspecified perforation of tympanic membrane, right ear: Secondary | ICD-10-CM | POA: Insufficient documentation

## 2023-03-05 DIAGNOSIS — H7290 Unspecified perforation of tympanic membrane, unspecified ear: Secondary | ICD-10-CM

## 2023-03-05 DIAGNOSIS — H66013 Acute suppurative otitis media with spontaneous rupture of ear drum, bilateral: Secondary | ICD-10-CM

## 2023-03-05 DIAGNOSIS — H6690 Otitis media, unspecified, unspecified ear: Secondary | ICD-10-CM

## 2023-03-05 DIAGNOSIS — H9201 Otalgia, right ear: Secondary | ICD-10-CM | POA: Diagnosis present

## 2023-03-05 MED ORDER — CIPROFLOXACIN-DEXAMETHASONE 0.3-0.1 % OT SUSP
4.0000 [drp] | Freq: Two times a day (BID) | OTIC | 0 refills | Status: DC
  Filled 2023-03-05: qty 7.5, 19d supply, fill #0

## 2023-03-05 MED ORDER — KETOROLAC TROMETHAMINE 30 MG/ML IJ SOLN
30.0000 mg | Freq: Once | INTRAMUSCULAR | Status: AC
  Administered 2023-03-05: 30 mg via INTRAMUSCULAR
  Filled 2023-03-05: qty 1

## 2023-03-05 MED ORDER — HYDROCODONE-ACETAMINOPHEN 5-325 MG PO TABS
1.0000 | ORAL_TABLET | Freq: Four times a day (QID) | ORAL | 0 refills | Status: AC | PRN
  Filled 2023-03-05: qty 12, 3d supply, fill #0

## 2023-03-05 MED ORDER — CEFDINIR 300 MG PO CAPS
600.0000 mg | ORAL_CAPSULE | Freq: Every day | ORAL | 0 refills | Status: AC
  Filled 2023-03-05: qty 20, 10d supply, fill #0

## 2023-03-05 MED ORDER — CIPROFLOXACIN HCL 500 MG PO TABS: 500.0000 mg | ORAL_TABLET | Freq: Two times a day (BID) | ORAL | 0 refills | Status: AC

## 2023-03-05 MED ORDER — CIPROFLOXACIN-DEXAMETHASONE 0.3-0.1 % OT SUSP: 4.0000 [drp] | Freq: Two times a day (BID) | OTIC | 0 refills | Status: DC

## 2023-03-05 MED ORDER — OFLOXACIN 0.3 % OP SOLN: 4.0000 [drp] | Freq: Four times a day (QID) | OPHTHALMIC | 0 refills | Status: AC

## 2023-03-05 MED ORDER — CEFDINIR 300 MG PO CAPS
600.0000 mg | ORAL_CAPSULE | Freq: Every day | ORAL | 0 refills | Status: DC
Start: 2023-03-05 — End: 2023-03-05

## 2023-03-05 NOTE — ED Provider Notes (Signed)
[place] EMERGENCY DEPARTMENT AT [facility] Provider Note   CSN: [id number] Arrival date & time: 03/05/23  0707     History  Chief Complaint  Patient presents with   Otalgia    Sherri Williams is a 37 y.o. female.  Patient 37 year old female presenting for ear pain.  Patient was seen on 03/03/2023, approximately 2 days ago, for URI symptoms and diagnosed with otitis media on the right side.  Patient has an amoxicillin allergy and was sent home with azithromycin and is currently on a 2 out of the 5-day Z-Pak course.  Patient presenting for worsening right ear pain as well as new drainage and blood.  Denies to several episodes of coughing and blowing her nose with severe pain.  Denies fevers or chills.  Admits to pain on the left but less severe.  The history is provided by the patient. No language interpreter was used.  Otalgia Associated symptoms: no fever        Home Medications Prior to Admission medications   Medication Sig Start Date End Date Taking? Authorizing Provider  cefdinir (OMNICEF) 300 MG capsule Take 2 capsules (600 mg total) by mouth daily for 10 days. 03/05/23 03/15/23  Edwin Dada P, DO  ciprofloxacin (CIPRO) 500 MG tablet Take 1 tablet (500 mg total) by mouth 2 (two) times daily for 10 days. 03/05/23 03/15/23  Read Drivers, MD  ciprofloxacin-dexamethasone (CIPRODEX) OTIC suspension Place 4 drops into both ears 2 (two) times daily. 03/05/23   Edwin Dada P, DO  ofloxacin (OCUFLOX) 0.3 % ophthalmic solution Place 4 drops into both eyes 4 (four) times daily for 14 days. This is an eye drop but can be used for ears. Use in both ears twice per day for 14 days 03/05/23 03/19/23  Read Drivers, MD  sertraline (ZOLOFT) 25 MG tablet Take 1 tablet (25 mg total) by mouth daily. 10/23/22   Dulce Sellar, NP      Allergies    Amoxicillin    Review of Systems   Review of Systems  Constitutional:  Negative for chills and fever.  HENT:  Positive for ear pain.      Physical Exam Updated Vital Signs BP 118/74 (BP Location: Left Arm)   Pulse 84   Temp 98.3 F (36.8 C) (Oral)   Resp 18   LMP 02/09/2023   SpO2 99%  Physical Exam Vitals and nursing note reviewed.  HENT:     Head: Normocephalic and atraumatic.     Right Ear: Drainage and tenderness present. There is impacted cerumen.     Left Ear: Tenderness present. Tympanic membrane is not perforated, erythematous or retracted.     Ears:     Comments: Unable to visualize tm on the right due to cerumen and blood.  Cardiovascular:     Rate and Rhythm: Normal rate.     Pulses: Normal pulses.  Neurological:     Mental Status: She is alert.     ED Results / Procedures / Treatments   Labs (all labs ordered are listed, but only abnormal results are displayed) Labs Reviewed - No data to display  EKG None  Radiology No results found.  Procedures Procedures    Medications Ordered in ED Medications  ketorolac (TORADOL) 30 MG/ML injection 30 mg (30 mg Intramuscular Given 03/05/23 0809)    ED Course/ Medical Decision Making/ A&P  Medical Decision Making Risk Prescription drug management.   18:44 AM  37 year old female presenting for ear pain.  Is alert and oriented x 3, no acute distress, afebrile, stable vital signs.  No signs or symptoms of sepsis.  Physical exam demonstrates otitis externa on the left.  Clear tympanic membrane.  On the right patient has erythema of the external ear canal with cerumen and blood.  Unable to visualize the TM at this time.  Concern for likely perforation.  Patient given medication for pain control and sent to pharmacy.  I spoke with on call ENT who recommends outpatient f/u in [facility]. I spoke with office manager who can get the patient in at [facility]. Ciprodex and cefdinir prescribed.   Patient in no distress and overall condition improved here in the ED. Detailed discussions were had with the patient regarding current  findings, and need for close f/u with PCP or on call doctor. The patient has been instructed to return immediately if the symptoms worsen in any way for re-evaluation. Patient verbalized understanding and is in agreement with current care plan. All questions answered prior to discharge.         Final Clinical Impression(s) / ED Diagnoses Final diagnoses:  Acute otitis media, unspecified otitis media type  Perforation of tympanic membrane, unspecified laterality    Rx / DC Orders ED Discharge Orders          Ordered    ciprofloxacin-dexamethasone (CIPRODEX) OTIC suspension  2 times daily,   Status:  Discontinued        03/05/23 0812    cefdinir (OMNICEF) 300 MG capsule  Daily,   Status:  Discontinued        03/05/23 0812    cefdinir (OMNICEF) 300 MG capsule  Daily        03/05/23 0954    ciprofloxacin-dexamethasone (CIPRODEX) OTIC suspension  2 times daily        03/05/23 0954    HYDROcodone-acetaminophen (NORCO) 5-325 MG tablet  Every 6 hours PRN        03/05/23 0954              Franne Forts, DO 03/10/23 0007

## 2023-03-05 NOTE — Progress Notes (Signed)
Otolaryngology Clinic Note HPI:  Sherri Williams is a 37 y.o. female without contributory PMHx kindly referred by Dr. Andree Coss for evaluation of concern for bilateral AOM from the ED. Patient reports that she had URI symptoms starting 8/31, mostly sinonasal pain, clear drainage, and facial pressure. Denies hyposmia. She also had fevers. No sick contacts. Sinus symptoms improved and completely resolved, but progressed to bilateral ear fullness starting ~9/1. Went to ED, dx with b/l AOM and prescribed Z-pak. Did not improve, with increasing R > L pain and R bloody otorrhea so went to ED again today.  Reports + R > L otalgia,R bloody otorrhea, bilateral subjective HL. Denies vertigo, facial weakness, postauricular pain, history of ear surgery/frequent ear infections, current fevers, barotrauma, ear instrumentation or q-tip use, ear surgery. COVID negative.  No CT imaging.   PMHx, PSHx, FHx reviewed as below. Denies problems with bleeding or anesthesia in the past. No diabetes.  Does smoke occassionally.  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   Trichomonas infection 06/10/2021   Past Surgical History:  Procedure Laterality Date   CESAREAN SECTION  2011   LTCS  2-layer closure   TONSILLECTOMY     Family History  Problem Relation Age of Onset   Sickle cell trait Mother    Hypertension Maternal Grandmother    Diabetes Maternal Grandmother    Sickle cell trait Brother    Social Connections: Socially Isolated (10/23/2022)   Social Connection and Isolation Panel [NHANES]    Frequency of Communication with Friends and Family: More than three times a week    Frequency of Social Gatherings with Friends and Family: Once a week    Attends Religious Services:  Never    Database administrator or Organizations: No    Attends Engineer, structural: Not on file    Marital Status: Never married     Current Outpatient Medications:    cefdinir (OMNICEF) 300 MG capsule, Take 2 capsules (600 mg total) by mouth daily for 10 days., Disp: 20 capsule, Rfl: 0   ciprofloxacin (CIPRO) 500 MG tablet, Take 1 tablet (500 mg total) by mouth 2 (two) times daily for 10 days., Disp: 20 tablet, Rfl: 0   ciprofloxacin-dexamethasone (CIPRODEX) OTIC suspension, Place 4 drops into both ears 2 (two) times daily., Disp: 7.5 mL, Rfl: 0   HYDROcodone-acetaminophen (NORCO) 5-325 MG tablet, Take 1 tablet by mouth every 6 (six) hours as needed for up to 3 days for moderate pain., Disp: 12 tablet, Rfl: 0   ofloxacin (OCUFLOX) 0.3 % ophthalmic solution, Place 4 drops into both eyes 4 (four) times daily for 14 days. This is an eye drop but can be used for ears. Use in both ears twice per day for 14 days, Disp: 5 mL, Rfl: 0   sertraline (ZOLOFT) 25 MG tablet, Take 1 tablet (25 mg total) by mouth daily., Disp: 30 tablet, Rfl: 1  A 20-point ROS was performed with pertinent positives/negatives noted in the HPI. The remainder of the ROS are negative.   Physical Exam:   BP 112/74 (BP Location: Right Arm, Patient Position: Sitting, Cuff Size: Large)   Pulse 85   Ht 5\' 6"  (1.676 m)   Wt 207 lb (93.9 kg)   LMP 02/09/2023   SpO2 99%  BMI 33.41 kg/m   Relevant findings: CN II-XII intact; HB 1/6 b/l Weber 256 and 512 midline; Rinne BC>AC b/l Left EAC with mild amount of cerumen, TM intact with serous effusion noted. No obvious evidence of perforation Right EAC: mild edema, moderate cerumen which was removed (see below). Small amount of mucoid secretions and bloody secretions which were cleaned. TM bulging, monomeric aspect anteriorly with small perforation and a bleb, decompressed after suctioning. No postauricular erythema or fluctuance. No lesions of oral cavity/oropharynx No  neck masses/lymphadenopathy/thyromegaly No respiratory distress or stridor  Independent Review of Additional Tests or Records:  ED notes reviewed and summarized above Labs reviewed including COVID  Procedures:  Procedure: Bilateral ear microscopy and debridement using microscope (CPT [phone]) Pre-procedure diagnosis: bilateral acute otitis media Post-procedure diagnosis: same Indication: bilateral otitis media Procedure: Patient was placed semi-recumbent. Both ear canals were examined with findings above. Some cerumen removed on left and on right using #5 suction and currette. On Left, TM intact with serous effusion noted. No obvious evidence of perforation. On Right, EAC with mild edema and some wet debris removed from EAC. TM was noted to be bulging with monomeric aspect anteriorly with small perforation likely through which bloody otorrhea and mucoid drainage was noted. Both of these were suctioned, and TM gently suctioned around the perforation which decompressed the TM. Ofloxacin drops were applied. Patient tolerated the procedure well.  Impression & Plans:  Sherri Williams is a 37 y.o. female without contributory PMHx kindly referred for evaluation of concern for bilateral AOM from the ED after URI starting on 8/31. Sinus symptoms resolved, but worsening otalgia and now right otorrhea not responsive to azithromycin so went to ED and referred for urgent visit today. Exam consistent with b/l AOM, though worse on right. No clinical evidence of mastoiditis. Ears cleaned, TM decompressed and drops applied.  B/l AOM: - discontinue azithromycin - start cefdinir as prescribed (allergy to PCN) - start ciprofloxacin 500mg  BID x10d - Ciprodex drops QID x14 d - Dry ear precautions, no ear instrumentation. - Return precautions including fever, mental status changes, and postauricular swelling or acute complaints.  - RTC 2 wks  MDM:  Level IV 99204 - Review of notes, results, and independent test  interpretation (Resp Panel); Drug prescribed. Time spent on this encounter including review of documentation and results, examination, and patient counseling personally performed: 47 minutes  Read Drivers MD

## 2023-03-05 NOTE — ED Triage Notes (Signed)
Pt arrives to ED with c/o om-going otalgia from otitis media dx x2 days ago.

## 2023-03-05 NOTE — Discharge Instructions (Signed)
Today your exam demonstrated an otitis externa on the left with earwax and blood in the right ear concerning for eardrum perforation.  At this time I am unable to visualize the eardrum.  It is very important that you follow-up closely today to have the earwax and blood removed by an ENT specialist so that you can start to improve and that we can see the eardrum.  The antibiotics that were originally given for you do not cover well for the ear.  And going to change those antibiotics and start you on drops with steroids that are called Ciprodex.  Please take these as prescribed.  I am also sending narcotic pain medications to your pharmacy for you.  We have scheduled you for a 2 PM appointment with an ENT specialist.  Do not miss this appointment.

## 2023-03-09 ENCOUNTER — Encounter (INDEPENDENT_AMBULATORY_CARE_PROVIDER_SITE_OTHER): Payer: Self-pay | Admitting: Otolaryngology

## 2023-03-17 ENCOUNTER — Other Ambulatory Visit (HOSPITAL_BASED_OUTPATIENT_CLINIC_OR_DEPARTMENT_OTHER): Payer: Self-pay

## 2023-03-20 MED ORDER — CIPROFLOXACIN HCL 500 MG PO TABS: 500.0000 mg | ORAL_TABLET | Freq: Two times a day (BID) | ORAL | 0 refills | Status: AC

## 2023-03-23 ENCOUNTER — Encounter (INDEPENDENT_AMBULATORY_CARE_PROVIDER_SITE_OTHER): Payer: Self-pay | Admitting: Otolaryngology

## 2023-03-23 ENCOUNTER — Ambulatory Visit (INDEPENDENT_AMBULATORY_CARE_PROVIDER_SITE_OTHER): Payer: BLUE CROSS/BLUE SHIELD | Admitting: Otolaryngology

## 2023-03-23 VITALS — BP 124/81 | HR 72 | Ht 66.0 in | Wt 212.0 lb

## 2023-03-23 DIAGNOSIS — H66013 Acute suppurative otitis media with spontaneous rupture of ear drum, bilateral: Secondary | ICD-10-CM

## 2023-03-23 DIAGNOSIS — H66003 Acute suppurative otitis media without spontaneous rupture of ear drum, bilateral: Secondary | ICD-10-CM

## 2023-03-23 MED ORDER — HYDROCODONE-ACETAMINOPHEN 5-325 MG PO TABS: 1.0000 | ORAL_TABLET | Freq: Four times a day (QID) | ORAL | 0 refills | Status: DC | PRN

## 2023-03-23 MED ORDER — CIPROFLOXACIN-DEXAMETHASONE 0.3-0.1 % OT SUSP: 4.0000 [drp] | Freq: Two times a day (BID) | OTIC | 0 refills | Status: DC

## 2023-03-23 NOTE — Progress Notes (Signed)
Otolaryngology Clinic Note HPI:  Sherri Williams is a 37 y.o. female without contributory PMHx kindly referred by Dr. Andree Coss for evaluation of concern for bilateral AOM from the ED.  Initial visit (03/2023): Patient reports that she had URI symptoms starting 8/31, mostly sinonasal pain, clear drainage, and facial pressure. Denies hyposmia. She also had fevers. No sick contacts. Sinus symptoms improved and completely resolved, but progressed to bilateral ear fullness starting ~9/1. Went to ED, dx with b/l AOM and prescribed Z-pak. Did not improve, with increasing R > L pain and R bloody otorrhea so went to ED again today.  Reports + R > L otalgia,R bloody otorrhea, bilateral subjective HL. Denies vertigo, facial weakness, postauricular pain, history of ear surgery/frequent ear infections, current fevers, barotrauma, ear instrumentation or q-tip use, ear surgery. COVID negative.  No CT imaging.   Today (03/23/2023): Patient reports that she is actually doing better. Hearing she feels like is back to normal, but still having intermittent pain and fullness. Over the weekend, was prescribed another short course of cipro as she felt like the infection was coming on. Denies vertigo, facial weakness, postauricular pain, h/o ear infections, fevers. Has been keeping ear dry.  PMHx, PSHx, FHx reviewed as below. Denies problems with bleeding or anesthesia in the past. No diabetes.  Does smoke occassionally.  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   Trichomonas infection 06/10/2021   Past Surgical History:  Procedure Laterality Date   CESAREAN SECTION  2011   LTCS  2-layer closure   TONSILLECTOMY     Family History  Problem Relation Age of Onset   Sickle cell  trait Mother    Hypertension Maternal Grandmother    Diabetes Maternal Grandmother    Sickle cell trait Brother    Social Connections: Socially Isolated (10/23/2022)   Social Connection and Isolation Panel [NHANES]    Frequency of Communication with Friends and Family: More than three times a week    Frequency of Social Gatherings with Friends and Family: Once a week    Attends Religious Services: Never    Database administrator or Organizations: No    Attends Engineer, structural: Not on file    Marital Status: Never married     Current Outpatient Medications:    HYDROcodone-acetaminophen (NORCO) 5-325 MG tablet, Take 1 tablet by mouth every 6 (six) hours as needed for moderate pain., Disp: 5 tablet, Rfl: 0   ciprofloxacin (CIPRO) 500 MG tablet, Take 1 tablet (500 mg total) by mouth 2 (two) times daily for 5 days., Disp: 10 tablet, Rfl: 0   ciprofloxacin-dexamethasone (CIPRODEX) OTIC suspension, Place 4 drops into both ears 2 (two) times daily., Disp: 7.5 mL, Rfl: 0   sertraline (ZOLOFT) 25 MG tablet, Take 1 tablet (25 mg total) by mouth daily., Disp: 30 tablet, Rfl: 1  A 20-point ROS was performed with pertinent positives/negatives noted in the HPI. The remainder of the ROS are negative.   Physical Exam:   BP 124/81 (BP Location: Right Arm, Patient Position: Sitting, Cuff Size: Normal)   Pulse 72   Ht 5\' 6"  (1.676 m)   Wt 212 lb (96.2 kg)   LMP 02/09/2023   SpO2 100%   BMI 34.22 kg/m   Relevant findings: CN II-XII intact; HB 1/6  b/l Weber 256 and 512 midline; Rinne BC>AC b/l Left EAC with mild amount of cerumen, TM intact, no effusion noted. No obvious evidence of perforation Right EAC: no edema, there is a small crust deep in postero-superior canal which was removed using alligators under the microscope. I am unable to fully visualize the postero-superior quadrant of TM due to the crusting but I can see the anterior and inferior quadrants of TM are intact without  effusion today. No postauricular erythema or fluctuance. No lesions of oral cavity/oropharynx No neck masses/lymphadenopathy/thyromegaly No respiratory distress or stridor  Independent Review of Additional Tests or Records:  ED notes reviewed and summarized above Labs reviewed including COVID  Procedures:  Procedure: Bilateral ear microscopy and debridement using microscope (CPT [phone]) Pre-procedure diagnosis: right acute otitis media - same Post-procedure diagnosis: same Indication: right otitis media Procedure: Patient was placed semi-recumbent. Both ear canals were examined with findings above. The crust over right deep EAC was removed using alligator forceps. The left ear did not have an effusion with well aerated middle ear. Visualized TM on right was intact with aerated middle ear space. Ofloxacin drops were applied. Patient tolerated the procedure well.  Impression & Plans:  Sherri Williams is a 37 y.o. female without contributory PMHx kindly referred for evaluation of concern for bilateral AOM from the ED after URI starting on 8/31. Sinus symptoms resolved, but worsening otalgia and right otorrhea not responsive to azithromycin so went to ED. Exam consistent with b/l AOM and has improved on PO antibiotics and drops.  The AOM appears to be resolving. The right ear still had a crust deep, which was partially removed with some left in place because of patient tolerance.  B/l AOM: resolving - finish ciprofloxacin 500mg  BID x5d - start cefdinir as prescribed (allergy to PCN) - Ciprodex drops 4 drops BID x14 d - Dry ear precautions, no ear instrumentation. - Still in fair amount of pain - Norco rescue prescription (only 5 tabs); advised no further narcotics - Return precautions discussed  - RTC 4 weeks  MDM:  Level IV 99213 - Review of notes, and prescription drug prescribed  Read Drivers MD

## 2023-03-24 ENCOUNTER — Encounter (INDEPENDENT_AMBULATORY_CARE_PROVIDER_SITE_OTHER): Payer: Self-pay | Admitting: Otolaryngology

## 2023-04-22 ENCOUNTER — Ambulatory Visit (INDEPENDENT_AMBULATORY_CARE_PROVIDER_SITE_OTHER): Payer: BLUE CROSS/BLUE SHIELD

## 2023-07-03 ENCOUNTER — Other Ambulatory Visit (HOSPITAL_COMMUNITY)
Admission: RE | Admit: 2023-07-03 | Discharge: 2023-07-03 | Disposition: A | Discharge status: Home / Self Care | Payer: BLUE CROSS/BLUE SHIELD | Source: Ambulatory Visit | Attending: Family | Admitting: Family

## 2023-07-03 ENCOUNTER — Ambulatory Visit: Payer: BLUE CROSS/BLUE SHIELD | Admitting: Family

## 2023-07-03 ENCOUNTER — Encounter: Payer: Self-pay | Admitting: Family

## 2023-07-03 ENCOUNTER — Telehealth: Payer: Self-pay | Admitting: Family

## 2023-07-03 VITALS — BP 118/80 | HR 81 | Temp 97.4°F | Ht 66.0 in | Wt 232.6 lb

## 2023-07-03 DIAGNOSIS — Z13220 Encounter for screening for lipoid disorders: Secondary | ICD-10-CM | POA: Diagnosis not present

## 2023-07-03 DIAGNOSIS — Z6837 Body mass index (BMI) 37.0-37.9, adult: Secondary | ICD-10-CM

## 2023-07-03 DIAGNOSIS — E669 Obesity, unspecified: Secondary | ICD-10-CM | POA: Diagnosis not present

## 2023-07-03 DIAGNOSIS — Z0000 Encounter for general adult medical examination without abnormal findings: Secondary | ICD-10-CM

## 2023-07-03 DIAGNOSIS — F332 Major depressive disorder, recurrent severe without psychotic features: Secondary | ICD-10-CM

## 2023-07-03 DIAGNOSIS — Z113 Encounter for screening for infections with a predominantly sexual mode of transmission: Secondary | ICD-10-CM

## 2023-07-03 LAB — COMPREHENSIVE METABOLIC PANEL
ALT: 16 U/L (ref 0–35)
AST: 19 U/L (ref 0–37)
Albumin: 4.1 g/dL (ref 3.5–5.2)
Alkaline Phosphatase: 45 U/L (ref 39–117)
BUN: 16 mg/dL (ref 6–23)
CO2: 29 meq/L (ref 19–32)
Calcium: 9.5 mg/dL (ref 8.4–10.5)
Chloride: 103 meq/L (ref 96–112)
Creatinine, Ser: 0.72 mg/dL (ref 0.40–1.20)
GFR: 107.21 mL/min (ref >60.00)
Glucose, Bld: 81 mg/dL (ref 70–99)
Potassium: 3.6 meq/L (ref 3.5–5.1)
Sodium: 139 meq/L (ref 135–145)
Total Bilirubin: 0.3 mg/dL (ref 0.2–1.2)
Total Protein: 7.1 g/dL (ref 6.0–8.3)

## 2023-07-03 LAB — LIPID PANEL
Cholesterol: 131 mg/dL (ref 0–200)
HDL: 43.2 mg/dL (ref >39.00)
LDL Cholesterol: 68 mg/dL (ref 0–99)
NonHDL: 87.8
Total CHOL/HDL Ratio: 3
Triglycerides: 98 mg/dL (ref 0.0–149.0)
VLDL: 19.6 mg/dL (ref 0.0–40.0)

## 2023-07-03 LAB — CBC WITH DIFFERENTIAL/PLATELET
Basophils Absolute: 0 10*3/uL (ref 0.0–0.1)
Basophils Relative: 0.5 % (ref 0.0–3.0)
Eosinophils Absolute: 0.2 10*3/uL (ref 0.0–0.7)
Eosinophils Relative: 3.4 % (ref 0.0–5.0)
HCT: 37.4 % (ref 36.0–46.0)
Hemoglobin: 12.1 g/dL (ref 12.0–15.0)
Lymphocytes Relative: 35.5 % (ref 12.0–46.0)
Lymphs Abs: 1.6 10*3/uL (ref 0.7–4.0)
MCHC: 32.3 g/dL (ref 30.0–36.0)
MCV: 82.7 fL (ref 78.0–100.0)
Monocytes Absolute: 0.4 10*3/uL (ref 0.1–1.0)
Monocytes Relative: 10.1 % (ref 3.0–12.0)
Neutro Abs: 2.2 10*3/uL (ref 1.4–7.7)
Neutrophils Relative %: 50.5 % (ref 43.0–77.0)
Platelets: 311 10*3/uL (ref 150.0–400.0)
RBC: 4.53 Mil/uL (ref 3.87–5.11)
RDW: 15.6 % — ABNORMAL HIGH (ref 11.5–15.5)
WBC: 4.4 10*3/uL (ref 4.0–10.5)

## 2023-07-03 MED ORDER — ESCITALOPRAM OXALATE 5 MG PO TABS
5.0000 mg | ORAL_TABLET | Freq: Every day | ORAL | 0 refills | Status: DC
Start: 2023-07-03 — End: 2023-07-27

## 2023-07-03 NOTE — Assessment & Plan Note (Signed)
 chronic; Patient has gained significant weight over the past 5 months, likely related to depression. Expressed concern about BMI and lack of motivation to exercise. -Encouraged to reduce caloric intake, particularly carbs and sweets, and increase physical activity. -Consider referral to weight loss clinic if needed.

## 2023-07-03 NOTE — Progress Notes (Signed)
 Phone [phone]  Subjective:   Patient is a 38 y.o. female presenting for annual physical.    Chief Complaint  Patient presents with   Annual Exam    Pt non - fasting, would like to discuss updating FMLA paperwork    Depression   Hypertension   Discussed the use of AI scribe software for clinical note transcription with the patient, who gave verbal consent to proceed.  History of Present Illness   The patient, with a history of depression and anxiety, presents for a physical examination and renewal of her FMLA paperwork. She reports discontinuing her previous medications due to feeling like a 'zombie' and financial constraints. The patient describes an episode where she felt 'out of it' while trying to relax in a bath. She also experienced homelessness during the summer, which further complicated her ability to maintain her prescriptions. Despite stopping her medications, the patient has been managing her symptoms with therapy through her job's [employer] ([employer]), which she utilizes about twice a month. She also mentions a significant weight gain of about twenty pounds in the past five months. She expresses a lack of motivation to exercise and a struggle with overeating, particularly sweets and carbs. The patient also mentions a previous ear infection in both ears, with the right eardrum having ruptured. She reports feeling like the infection might be returning.      See problem oriented charting- ROS- full  review of systems was completed and negative except for depression noted in HPI above.  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   Trichomonas infection 06/10/2021   Patient Active Problem List    Diagnosis Date Noted   Cigarette nicotine dependence without complication 06/06/2021   Depression 08/15/2011   Past Surgical History:  Procedure Laterality Date   CESAREAN SECTION  2011   LTCS  2-layer closure   TONSILLECTOMY      Family History  Problem Relation Age of Onset   Sickle cell trait Mother    Hypertension Maternal Grandmother    Diabetes Maternal Grandmother    Sickle cell trait Brother     Medications- reviewed and updated Current Outpatient Medications  Medication Sig Dispense Refill   escitalopram  (LEXAPRO ) 5 MG tablet Take 1 tablet (5 mg total) by mouth daily. 30 tablet 0   No current facility-administered medications for this visit.    Allergies-reviewed and updated Allergies  Allergen Reactions   Amoxicillin Other (See Comments)    Unsure - mother's throat closes up Has patient had a PCN reaction causing immediate rash, facial/tongue/throat swelling, SOB or lightheadedness with hypotension: unknown Has patient had a PCN reaction causing severe rash involving mucus membranes or skin necrosis: No Has patient had a PCN reaction that required hospitalization Yes Has patient had a PCN reaction occurring within the last 10 years: No If all of the above answers are NO, then may proceed with Cephalosporin use.     Social History   Social History Narrative   Not on file    Objective:  BP 118/80   Pulse 81   Temp (!) 97.4 F (36.3 C)   Ht 5' 6 (1.676 m)   Wt 232 lb 9.6 oz (105.5 kg)   SpO2 98%   BMI 37.54 kg/m  Physical Exam Vitals and nursing note reviewed.  Constitutional:      Appearance: Normal appearance.  HENT:     Head: Normocephalic.     Right Ear: Tympanic membrane normal.     Left Ear: Tympanic membrane normal.     Nose: Nose normal.     Mouth/Throat:     Mouth: Mucous membranes are moist.  Eyes:     Pupils: Pupils are equal, round, and reactive to light.  Cardiovascular:     Rate and Rhythm: Normal rate and regular rhythm.   Pulmonary:     Effort: Pulmonary effort is normal.     Breath sounds: Normal breath sounds.  Musculoskeletal:        General: Normal range of motion.     Cervical back: Normal range of motion.  Lymphadenopathy:     Cervical: No cervical adenopathy.  Skin:    General: Skin is warm and dry.  Neurological:     Mental Status: She is alert.  Psychiatric:        Mood and Affect: Mood normal.        Behavior: Behavior normal.     Assessment and Plan   Health Maintenance counseling: 1. Anticipatory guidance: Patient counseled regarding regular dental exams q6 months, eye exams,  avoiding smoking and second hand smoke, limiting alcohol to 1 beverage per day, no illicit drugs.   2. Risk factor reduction:  Advised patient of need for regular exercise and diet rich with fruits and vegetables to reduce risk of heart attack and stroke. Wt Readings from Last 3 Encounters:  07/03/23 232 lb 9.6 oz (105.5 kg)  03/23/23 212 lb (96.2 kg)  03/05/23 207 lb (93.9 kg)   3. Immunizations/screenings/ancillary studies Immunization History  Administered Date(s) Administered   Influenza,inj,Quad PF,6+ Mos 06/06/2021   PFIZER(Purple Top)SARS-COV-2 Vaccination 04/05/2020, 04/26/2020   Tdap 08/16/2011   There are no preventive care reminders to display for this patient.  4. Cervical cancer screening: done 2024, normal 5. Skin cancer screening- advised regular sunscreen use. Denies worrisome, changing, or new skin lesions.  6. Birth control/STD check: None, will do today 7. Smoking associated screening: non- smoker 8. Alcohol screening: 2 drinks/month  Assessment and Plan    Depression and Anxiety - Currently managing symptoms with [employer] counseling twice a month. Stopped Zoloft  d/t feeling zombie-like.  Expressed willingness to try a different medication. -Start escitalopram  (Lexapro ) 5mg  daily, advised on use & SE -Follow up in 1 month to assess response and adjust dosage if necessary.  Weight Gain  - Patient has gained significant weight over the past 5 months, likely related to depression. Expressed concern about BMI and lack of motivation to exercise. -Encouraged to reduce caloric intake, particularly carbs and sweets, and increase physical activity. -Consider referral to weight loss clinic if needed.  Ear Infection - History of bilateral ear infections with ruptured eardrum on the right side. Currently asymptomatic but with residual dried blood on lower edge of right TM, TM wnl. -Advised to instill warm water rinses in ear to help clear residual dried blood.  FMLA Paperwork - Patient requires renewal of FMLA paperwork for depression and anxiety. -Await fax from patient's employer and complete as necessary.  Annual Physical - -Ordering lipids, CBC, CMP today. -Perform STD testing via urine sample. -Continue regular dental hygiene. -Continue regular exercise and healthy diet. -Look for results on MyChart, review msg will be sent next week.      Recommended follow up:  Return in about 4 weeks (  around 07/31/2023) for depression, med refills, virtual ok. No future appointments.   Lab/Order associations:fasting    Lucius Krabbe, NP

## 2023-07-03 NOTE — Telephone Encounter (Signed)
 [employer] faxed  FMLA, to be filled out by provider. Patient requested to send it back via Fax within 5-days. Document is located in providers tray at front office.Please advise at  [phone].

## 2023-07-03 NOTE — Assessment & Plan Note (Signed)
 Chronic; Currently managing symptoms with [employer] counseling twice a month. Stopped Zoloft  d/t feeling zombie-like.  Expressed willingness to try a different medication. -Start escitalopram  (Lexapro ) 5mg  daily, advised on use & SE -Follow up in 1 month to assess response and adjust dosage if necessary.

## 2023-07-03 NOTE — Patient Instructions (Addendum)
 It was very nice to see you today!   I will review your lab results via MyChart in a few days. I have sent over the Lexapro  to your pharmacy. Schedule a one month follow up visit, ok if virtual.   Happy new year!!      PLEASE NOTE:  If you had any lab tests please let us  know if you have not heard back within a few days. You may see your results on MyChart before we have a chance to review them but we will give you a call once they are reviewed by us . If we ordered any referrals today, please let us  know if you have not heard from their office within the next week.

## 2023-07-06 LAB — URINE CYTOLOGY ANCILLARY ONLY
Chlamydia: NEGATIVE
Comment: NEGATIVE
Comment: NEGATIVE
Comment: NORMAL
Neisseria Gonorrhea: NEGATIVE
Trichomonas: NEGATIVE

## 2023-07-09 NOTE — Telephone Encounter (Signed)
 [employer] faxed additional FMLA paperwork, to be filled out by provider. Patient requested to send it back via Fax within 5-days. Document is located in providers tray at front office.Please advise at  [phone].

## 2023-07-13 ENCOUNTER — Other Ambulatory Visit: Payer: Self-pay | Admitting: Family

## 2023-07-17 ENCOUNTER — Telehealth: Payer: Self-pay | Admitting: Family

## 2023-07-17 NOTE — Telephone Encounter (Signed)
[employer] faxed document FMLA, to be filled out by provider. Patient requested to send it back via Fax within 5-days. Document is located in providers tray at front office.Please advise at [facility] [phone]

## 2023-07-23 NOTE — Telephone Encounter (Signed)
So need to know specific number of days. I do not write for more than 3 days per month ongoing as this is a treatable problem. Or I can excuse the specific days she is talking about in January and see if they will accept that. We also will need new forms. Let me know, thx

## 2023-07-26 ENCOUNTER — Other Ambulatory Visit: Payer: Self-pay | Admitting: Family

## 2023-07-26 DIAGNOSIS — F332 Major depressive disorder, recurrent severe without psychotic features: Secondary | ICD-10-CM

## 2023-07-31 ENCOUNTER — Encounter: Payer: Self-pay | Admitting: Family

## 2023-07-31 ENCOUNTER — Telehealth (INDEPENDENT_AMBULATORY_CARE_PROVIDER_SITE_OTHER): Payer: BLUE CROSS/BLUE SHIELD | Admitting: Family

## 2023-07-31 DIAGNOSIS — F332 Major depressive disorder, recurrent severe without psychotic features: Secondary | ICD-10-CM

## 2023-07-31 MED ORDER — ESCITALOPRAM OXALATE 10 MG PO TABS: 10.0000 mg | ORAL_TABLET | Freq: Every day | ORAL | 5 refills | Status: DC

## 2023-07-31 NOTE — Progress Notes (Signed)
MyChart Video Visit    Virtual Visit via Video Note   This format is felt to be most appropriate for this patient at this time. Physical exam was limited by quality of the video and audio technology used for the visit. CMA was able to get the patient set up on a video visit.  Patient location: Home. Patient and provider in visit Provider location: Office  I discussed the limitations of evaluation and management by telemedicine and the availability of in person appointments. The patient expressed understanding and agreed to proceed.  Visit Date: 07/31/2023  Today's healthcare provider: Dulce Sellar, NP     Subjective:   Patient ID: Sherri Williams, female    DOB: July 03, 1985, 38 y.o.   MRN: [id number]  Chief Complaint  Patient presents with   Depression    4 wk f/u   Discussed the use of AI scribe software for clinical note transcription with the patient, who gave verbal consent to proceed.  History of Present Illness   The patient presents with depression management. She is currently managing her depression with Lexapro, taking a low dose of 5 mg daily. She did experience some drowsiness as a side effect but switched to taking at bedtime and no longer drowsy, she reports feeling a little better overall, but not at goal. She has been prescribed a 90-day supply of Lexapro. She is actively participating in therapy and counseling sessions as part of her treatment plan.     Assessment & Plan:     Depression - Partial response to Lexapro 5mg  daily. No significant side effects reported. Discussed increasing the dose to improve mood symptoms. -Increase Lexapro to 10mg  daily by taking two 5mg  tablets until the current supply runs out. -Notify the office before running out of the current supply to prescribe the 10mg  tablets. -Continue ongoing therapy. -Follow-up in 3 months in the office for a complete evaluation including vital signs.      Past Medical History:  Diagnosis  Date   Acne nodule 06/06/2021   Annual physical exam 06/06/2021   Depression    Elevated liver function tests 08/15/2011   Need for immunization against influenza 06/06/2021   Postpartum hypertension 08/15/2011   Trichomonas infection 06/10/2021    Past Surgical History:  Procedure Laterality Date   CESAREAN SECTION  2011   LTCS  2-layer closure   TONSILLECTOMY      Outpatient Medications Prior to Visit  Medication Sig Dispense Refill   escitalopram (LEXAPRO) 5 MG tablet TAKE 1 TABLET (5 MG TOTAL) BY MOUTH DAILY. 90 tablet 1   No facility-administered medications prior to visit.    Allergies  Allergen Reactions   Amoxicillin Other (See Comments)    Unsure - mother's throat closes up Has patient had a PCN reaction causing immediate rash, facial/tongue/throat swelling, SOB or lightheadedness with hypotension: unknown Has patient had a PCN reaction causing severe rash involving mucus membranes or skin necrosis: No Has patient had a PCN reaction that required hospitalization Yes Has patient had a PCN reaction occurring within the last 10 years: No If all of the above answers are "NO", then may proceed with Cephalosporin use.        Objective:   Physical Exam Vitals and nursing note reviewed.  Constitutional:      General: Pt is not in acute distress.    Appearance: Normal appearance.  HENT:     Head: Normocephalic.  Pulmonary:     Effort: No respiratory distress.  Musculoskeletal:  Cervical back: Normal range of motion.  Skin:    General: Skin is dry.     Coloration: Skin is not pale.  Neurological:     Mental Status: Pt is alert and oriented to person, place, and time.  Psychiatric:        Mood and Affect: Mood normal.   There were no vitals taken for this visit.  Wt Readings from Last 3 Encounters:  07/03/23 232 lb 9.6 oz (105.5 kg)  03/23/23 212 lb (96.2 kg)  03/05/23 207 lb (93.9 kg)      I discussed the assessment and treatment plan with the  patient. The patient was provided an opportunity to ask questions and all were answered. The patient agreed with the plan and demonstrated an understanding of the instructions.   The patient was advised to call back or seek an in-person evaluation if the symptoms worsen or if the condition fails to improve as anticipated.  Dulce Sellar, NP [facility] [phone] (phone) [phone] (fax)  [facility]

## 2023-07-31 NOTE — Assessment & Plan Note (Signed)
  Partial response to Lexapro 5mg  daily. No significant side effects reported. Discussed increasing the dose to improve mood symptoms. -Increase Lexapro to 10mg  daily by taking two 5mg  tablets until the current supply runs out. -Notify the office before running out of the current supply to prescribe the 10mg  tablets. -Continue ongoing therapy. -Follow-up in 3 months in the office for a complete evaluation including vital signs.

## 2023-08-25 ENCOUNTER — Other Ambulatory Visit: Payer: Self-pay | Admitting: Family

## 2023-08-25 DIAGNOSIS — F332 Major depressive disorder, recurrent severe without psychotic features: Secondary | ICD-10-CM

## 2023-10-30 ENCOUNTER — Other Ambulatory Visit: Payer: Self-pay

## 2023-10-30 ENCOUNTER — Encounter (HOSPITAL_BASED_OUTPATIENT_CLINIC_OR_DEPARTMENT_OTHER): Payer: Self-pay

## 2023-10-30 ENCOUNTER — Emergency Department (HOSPITAL_BASED_OUTPATIENT_CLINIC_OR_DEPARTMENT_OTHER)

## 2023-10-30 ENCOUNTER — Emergency Department (HOSPITAL_BASED_OUTPATIENT_CLINIC_OR_DEPARTMENT_OTHER)
Admission: EM | Admit: 2023-10-30 | Discharge: 2023-10-30 | Disposition: A | Discharge status: Home / Self Care | Attending: Emergency Medicine | Admitting: Emergency Medicine

## 2023-10-30 DIAGNOSIS — R102 Pelvic and perineal pain: Secondary | ICD-10-CM | POA: Insufficient documentation

## 2023-10-30 DIAGNOSIS — I10 Essential (primary) hypertension: Secondary | ICD-10-CM | POA: Diagnosis not present

## 2023-10-30 DIAGNOSIS — N939 Abnormal uterine and vaginal bleeding, unspecified: Secondary | ICD-10-CM | POA: Diagnosis present

## 2023-10-30 LAB — CBC WITH DIFFERENTIAL/PLATELET
Abs Immature Granulocytes: 0.01 10*3/uL (ref 0.00–0.07)
Basophils Absolute: 0 10*3/uL (ref 0.0–0.1)
Basophils Relative: 1 %
Eosinophils Absolute: 0.1 10*3/uL (ref 0.0–0.5)
Eosinophils Relative: 3 %
HCT: 34.7 % — ABNORMAL LOW (ref 36.0–46.0)
Hemoglobin: 11 g/dL — ABNORMAL LOW (ref 12.0–15.0)
Immature Granulocytes: 0 %
Lymphocytes Relative: 43 %
Lymphs Abs: 2.1 10*3/uL (ref 0.7–4.0)
MCH: 25.8 pg — ABNORMAL LOW (ref 26.0–34.0)
MCHC: 31.7 g/dL (ref 30.0–36.0)
MCV: 81.5 fL (ref 80.0–100.0)
Monocytes Absolute: 0.4 10*3/uL (ref 0.1–1.0)
Monocytes Relative: 8 %
Neutro Abs: 2.3 10*3/uL (ref 1.7–7.7)
Neutrophils Relative %: 45 %
Platelets: 349 10*3/uL (ref 150–400)
RBC: 4.26 MIL/uL (ref 3.87–5.11)
RDW: 15.4 % (ref 11.5–15.5)
WBC: 4.9 10*3/uL (ref 4.0–10.5)
nRBC: 0 % (ref 0.0–0.2)

## 2023-10-30 LAB — URINALYSIS, ROUTINE W REFLEX MICROSCOPIC
Bacteria, UA: NONE SEEN
Bilirubin Urine: NEGATIVE
Glucose, UA: NEGATIVE mg/dL
Ketones, ur: NEGATIVE mg/dL
Leukocytes,Ua: NEGATIVE
Nitrite: NEGATIVE
Protein, ur: NEGATIVE mg/dL
Specific Gravity, Urine: 1.024 (ref 1.005–1.030)
pH: 6 (ref 5.0–8.0)

## 2023-10-30 LAB — COMPREHENSIVE METABOLIC PANEL WITH GFR
ALT: 17 U/L (ref 0–44)
AST: 20 U/L (ref 15–41)
Albumin: 4.3 g/dL (ref 3.5–5.0)
Alkaline Phosphatase: 57 U/L (ref 38–126)
Anion gap: 10 (ref 5–15)
BUN: 12 mg/dL (ref 6–20)
CO2: 26 mmol/L (ref 22–32)
Calcium: 9.6 mg/dL (ref 8.9–10.3)
Chloride: 103 mmol/L (ref 98–111)
Creatinine, Ser: 0.84 mg/dL (ref 0.44–1.00)
GFR, Estimated: >60 mL/min (ref >60)
Glucose, Bld: 97 mg/dL (ref 70–99)
Potassium: 3.7 mmol/L (ref 3.5–5.1)
Sodium: 139 mmol/L (ref 135–145)
Total Bilirubin: 0.3 mg/dL (ref 0.0–1.2)
Total Protein: 7.2 g/dL (ref 6.5–8.1)

## 2023-10-30 LAB — LIPASE, BLOOD: Lipase: 34 U/L (ref 11–51)

## 2023-10-30 LAB — PREGNANCY, URINE: Preg Test, Ur: NEGATIVE

## 2023-10-30 MED ORDER — SODIUM CHLORIDE 0.9 % IV BOLUS
1000.0000 mL | Freq: Once | INTRAVENOUS | Status: AC
  Administered 2023-10-30: 1000 mL via INTRAVENOUS

## 2023-10-30 NOTE — ED Provider Notes (Signed)
 [place] EMERGENCY DEPARTMENT AT [facility] Provider Note   CSN: [id number] Arrival date & time: 10/30/23  1120     History  Chief Complaint  Patient presents with   Vaginal Bleeding    Sherri Williams is a 38 y.o. female with past medical history of hypertension, obesity is presenting to emergency room room with complaint of vaginal bleeding.  She reports has been ongoing for 6 weeks she reports when it first started she had a normal.  That is never seem to stop, did get lighter through the middle of the month and then has started to become heavy with clots again.  She has not noted any other vaginal discharge and has not had any significant abdominal cramping or pain with this.  Reports that she has had some lightheaded sensation upon standing as of recently.  Has not been seen anywhere else and she is not on birth control.  She is sexually active.  Does not suspect that she is pregnant.  Denies NVD.   Vaginal Bleeding      Home Medications Prior to Admission medications   Medication Sig Start Date End Date Taking? Authorizing Provider  escitalopram  (LEXAPRO ) 10 MG tablet TAKE 1 TABLET BY MOUTH EVERY DAY 08/28/23   Versa Gore, NP      Allergies    Amoxicillin    Review of Systems   Review of Systems  Genitourinary:  Positive for vaginal bleeding.    Physical Exam Updated Vital Signs BP 121/84 (BP Location: Right Arm)   Pulse 70   Temp 98.2 F (36.8 C)   Resp 18   Ht 5\' 6"  (1.676 m)   Wt 108.9 kg   LMP 10/30/2023 (Exact Date)   SpO2 99%   BMI 38.74 kg/m  Physical Exam Vitals and nursing note reviewed.  Constitutional:      General: She is not in acute distress.    Appearance: She is not toxic-appearing.  HENT:     Head: Normocephalic and atraumatic.  Eyes:     General: No scleral icterus.    Conjunctiva/sclera: Conjunctivae normal.  Cardiovascular:     Rate and Rhythm: Normal rate and regular rhythm.     Pulses: Normal pulses.     Heart  sounds: Normal heart sounds.  Pulmonary:     Effort: Pulmonary effort is normal. No respiratory distress.     Breath sounds: Normal breath sounds.  Abdominal:     General: Abdomen is flat. Bowel sounds are normal.     Palpations: Abdomen is soft.     Tenderness: There is no abdominal tenderness.  Musculoskeletal:     Right lower leg: No edema.     Left lower leg: No edema.  Skin:    General: Skin is warm and dry.     Findings: No lesion.  Neurological:     General: No focal deficit present.     Mental Status: She is alert and oriented to person, place, and time. Mental status is at baseline.     ED Results / Procedures / Treatments   Labs (all labs ordered are listed, but only abnormal results are displayed) Labs Reviewed  CBC WITH DIFFERENTIAL/PLATELET - Abnormal; Notable for the following components:      Result Value   Hemoglobin 11.0 (*)    HCT 34.7 (*)    MCH 25.8 (*)    All other components within normal limits  URINALYSIS, ROUTINE W REFLEX MICROSCOPIC - Abnormal; Notable for the following components:  Hgb urine dipstick LARGE (*)    All other components within normal limits  COMPREHENSIVE METABOLIC PANEL WITH GFR  LIPASE, BLOOD  PREGNANCY, URINE  GC/CHLAMYDIA PROBE AMP ([place]) NOT AT [facility]    EKG None  Radiology US  Pelvis Complete Result Date: 10/30/2023 CLINICAL DATA:  Pelvic pain and vaginal bleeding. EXAM: TRANSABDOMINAL AND TRANSVAGINAL ULTRASOUND OF PELVIS DOPPLER ULTRASOUND OF OVARIES TECHNIQUE: Both transabdominal and transvaginal ultrasound examinations of the pelvis were performed. Transabdominal technique was performed for global imaging of the pelvis including uterus, ovaries, adnexal regions, and pelvic cul-de-sac. It was necessary to proceed with endovaginal exam following the transabdominal exam to visualize the adnexal structures. Color and duplex Doppler ultrasound was utilized to evaluate blood flow to the ovaries. COMPARISON:  None Available.  FINDINGS: Uterus Measurements: 8.9 x 4.4 x 5.4 cm = volume: 110.7 mL. There is a 1.3 x 1.1 x 1.3 cm subserosal fibroid uterine fundus. Endometrium Thickness: 15.3 mm.  No focal abnormality visualized. Right ovary Measurements: 3.4 x 1.4 x 1.8 cm = volume: 4.5 mL. Normal appearance/no adnexal mass. Left ovary Measurements: 2.8 x 2.3 x 2.3 cm = volume: 7.8 mL. Normal appearance/no adnexal mass. Pulsed Doppler evaluation of both ovaries demonstrates normal low-resistance arterial and venous waveforms. Other findings No abnormal free fluid. IMPRESSION: 1. Endometrium measures 15.3 mm. If bleeding remains unresponsive to hormonal or medical therapy, focal lesion work-up with sonohysterogram should be considered. Endometrial biopsy should also be considered in pre-menopausal patients at high risk for endometrial carcinoma. (Ref: Radiological Reasoning: Algorithmic Workup of Abnormal Vaginal Bleeding with Endovaginal Sonography and Sonohysterography. AJR 2008; 409:W11-91) 2. Fibroid uterus Electronically Signed   By: Jone Neither M.D.   On: 10/30/2023 13:28   US  Transvaginal Non-OB Result Date: 10/30/2023 CLINICAL DATA:  Pelvic pain and vaginal bleeding. EXAM: TRANSABDOMINAL AND TRANSVAGINAL ULTRASOUND OF PELVIS DOPPLER ULTRASOUND OF OVARIES TECHNIQUE: Both transabdominal and transvaginal ultrasound examinations of the pelvis were performed. Transabdominal technique was performed for global imaging of the pelvis including uterus, ovaries, adnexal regions, and pelvic cul-de-sac. It was necessary to proceed with endovaginal exam following the transabdominal exam to visualize the adnexal structures. Color and duplex Doppler ultrasound was utilized to evaluate blood flow to the ovaries. COMPARISON:  None Available. FINDINGS: Uterus Measurements: 8.9 x 4.4 x 5.4 cm = volume: 110.7 mL. There is a 1.3 x 1.1 x 1.3 cm subserosal fibroid uterine fundus. Endometrium Thickness: 15.3 mm.  No focal abnormality visualized. Right ovary  Measurements: 3.4 x 1.4 x 1.8 cm = volume: 4.5 mL. Normal appearance/no adnexal mass. Left ovary Measurements: 2.8 x 2.3 x 2.3 cm = volume: 7.8 mL. Normal appearance/no adnexal mass. Pulsed Doppler evaluation of both ovaries demonstrates normal low-resistance arterial and venous waveforms. Other findings No abnormal free fluid. IMPRESSION: 1. Endometrium measures 15.3 mm. If bleeding remains unresponsive to hormonal or medical therapy, focal lesion work-up with sonohysterogram should be considered. Endometrial biopsy should also be considered in pre-menopausal patients at high risk for endometrial carcinoma. (Ref: Radiological Reasoning: Algorithmic Workup of Abnormal Vaginal Bleeding with Endovaginal Sonography and Sonohysterography. AJR 2008; 478:G95-62) 2. Fibroid uterus Electronically Signed   By: Jone Neither M.D.   On: 10/30/2023 13:28   US  Art/Ven Flow Abd Pelv Doppler Result Date: 10/30/2023 CLINICAL DATA:  Pelvic pain and vaginal bleeding. EXAM: TRANSABDOMINAL AND TRANSVAGINAL ULTRASOUND OF PELVIS DOPPLER ULTRASOUND OF OVARIES TECHNIQUE: Both transabdominal and transvaginal ultrasound examinations of the pelvis were performed. Transabdominal technique was performed for global imaging of the pelvis including uterus,  ovaries, adnexal regions, and pelvic cul-de-sac. It was necessary to proceed with endovaginal exam following the transabdominal exam to visualize the adnexal structures. Color and duplex Doppler ultrasound was utilized to evaluate blood flow to the ovaries. COMPARISON:  None Available. FINDINGS: Uterus Measurements: 8.9 x 4.4 x 5.4 cm = volume: 110.7 mL. There is a 1.3 x 1.1 x 1.3 cm subserosal fibroid uterine fundus. Endometrium Thickness: 15.3 mm.  No focal abnormality visualized. Right ovary Measurements: 3.4 x 1.4 x 1.8 cm = volume: 4.5 mL. Normal appearance/no adnexal mass. Left ovary Measurements: 2.8 x 2.3 x 2.3 cm = volume: 7.8 mL. Normal appearance/no adnexal mass. Pulsed Doppler  evaluation of both ovaries demonstrates normal low-resistance arterial and venous waveforms. Other findings No abnormal free fluid. IMPRESSION: 1. Endometrium measures 15.3 mm. If bleeding remains unresponsive to hormonal or medical therapy, focal lesion work-up with sonohysterogram should be considered. Endometrial biopsy should also be considered in pre-menopausal patients at high risk for endometrial carcinoma. (Ref: Radiological Reasoning: Algorithmic Workup of Abnormal Vaginal Bleeding with Endovaginal Sonography and Sonohysterography. AJR 2008; 295:A21-30) 2. Fibroid uterus Electronically Signed   By: Jone Neither M.D.   On: 10/30/2023 13:28    Procedures Procedures    Medications Ordered in ED Medications - No data to display  ED Course/ Medical Decision Making/ A&P                                 Medical Decision Making Amount and/or Complexity of Data Reviewed Labs: ordered. Radiology: ordered.   This patient presents to the ED for concern of vaginal bleeding, this involves an extensive number of treatment options, and is a complaint that carries with it a high risk of complications and morbidity.  The differential diagnosis includes ectopic pregnancy, torsion, STD, urinary tract infection.,  Mass   Lab Tests:  I personally interpreted labs.  The pertinent results include:   CBC, CMP, lipase, UA, gonorrhea chlamydia pending.   Imaging Studies ordered:  I ordered imaging studies including Pelvic US   I independently visualized and interpreted imaging which showed no acute pathology with enlarged endometrial lining  I agree with the radiologist interpretation   Cardiac Monitoring: / EKG:  The patient was maintained on a cardiac monitor.    Problem List / ED Course / Critical interventions / Medication management  Reported to emergency room with vaginal bleeding.  She has had some small clots.  She is not excessively feeling pads or tampons.  She reports she has no  history of irregular.  Vitals  are stable, overall well appearing.  Mucous membranes.  She does not have pale conjunctiva.  No syncope or presyncopal episodes associated with this.  She has no focal area of abdominal tenderness on exam but has reported some pelvic pain thus will obtain ultrasound to rule out any acute pathology.  Offered pelvic exam however patient declined.  Will check basic labs including checking hemoglobin.  Patient is not wanting anything for pain at this time. Workup overall reassuring hemoglobin is stable at 11 which is comparable to prior recent labs.  Will have her start iron  supplements daily.  Pregnancy test negative discussed ultrasound results and labs with patient.  Will have her follow-up with OB for continued vaginal bleeding and follow-up on ultrasound results.  Patient expresses understanding agrees to plan, given return precaution.  I have reviewed the patients home medicines and have made adjustments as needed  Final Clinical Impression(s) / ED Diagnoses Final diagnoses:  Vaginal bleeding    Rx / DC Orders ED Discharge Orders     None         Eudora Heron, PA-C 10/30/23 1426    Albertus Hughs, DO 10/30/23 1429

## 2023-10-30 NOTE — Discharge Instructions (Addendum)
 Your ultrasound shows thickened layer of your uterus which is likely contributing to your vaginal bleeding.  You should follow-up with OB/GYN for these results and consider repeat ultrasound or endometrial biopsy if bleeding continues.  Please return to the ER with new or worsening symptoms.

## 2023-10-30 NOTE — ED Notes (Signed)
 Pt is aware that a urine sample is needed, will try again later.

## 2023-10-30 NOTE — ED Triage Notes (Signed)
 Onset six weeks of virginal bleeding  States getting worse.  Having cramping.  States having some dizziness.  Weakness off and on

## 2023-10-30 NOTE — ED Notes (Signed)
 Pt alert and oriented X 4 at the time of discharge. RR even and unlabored. No acute distress noted. Pt verbalized understanding of discharge instructions as discussed. Pt ambulatory to lobby at time of discharge.

## 2023-11-02 LAB — GC/CHLAMYDIA PROBE AMP (~~LOC~~) NOT AT ARMC
Chlamydia: NEGATIVE
Comment: NEGATIVE
Comment: NORMAL
Neisseria Gonorrhea: NEGATIVE

## 2023-12-15 ENCOUNTER — Other Ambulatory Visit: Payer: Self-pay

## 2023-12-15 ENCOUNTER — Emergency Department (HOSPITAL_BASED_OUTPATIENT_CLINIC_OR_DEPARTMENT_OTHER)
Admission: EM | Admit: 2023-12-15 | Discharge: 2023-12-15 | Disposition: A | Discharge status: Home / Self Care | Attending: Emergency Medicine | Admitting: Emergency Medicine

## 2023-12-15 DIAGNOSIS — J069 Acute upper respiratory infection, unspecified: Secondary | ICD-10-CM | POA: Insufficient documentation

## 2023-12-15 DIAGNOSIS — R059 Cough, unspecified: Secondary | ICD-10-CM | POA: Diagnosis present

## 2023-12-15 LAB — RESP PANEL BY RT-PCR (RSV, FLU A&B, COVID)  RVPGX2
Influenza A by PCR: NEGATIVE
Influenza B by PCR: NEGATIVE
Resp Syncytial Virus by PCR: NEGATIVE
SARS Coronavirus 2 by RT PCR: NEGATIVE

## 2023-12-15 NOTE — ED Notes (Signed)
 DC paperwork given and verbally understood.

## 2023-12-15 NOTE — ED Provider Notes (Signed)
  [place] EMERGENCY DEPARTMENT AT [facility] Provider Note   CSN: [id number] Arrival date & time: 12/15/23  4098     Patient presents with: Flu Like Symptoms   Sherri Williams is a 38 y.o. female.   HPI Patient with URI symptoms.  Has had 4 3 days now.  Nasal congestion.  Some chills.  No frank fever.  Cough.  No shortness of breath.  No sputum production but does have nasal drainage.   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 Authorizing Provider  escitalopram  (LEXAPRO ) 10 MG tablet TAKE 1 TABLET BY MOUTH EVERY DAY 08/28/23   Versa Gore, NP    Allergies: Amoxicillin    Review of Systems  Updated Vital Signs BP (!) 123/90 (BP Location: Right Arm)   Pulse 77   Temp 98.9 F (37.2 C) (Oral)   Resp 18   SpO2 100%   Physical Exam Vitals and nursing note reviewed.  HENT:     Nose: Congestion present.     Mouth/Throat:     Pharynx: No posterior oropharyngeal erythema.   Cardiovascular:     Rate and Rhythm: Normal rate.  Pulmonary:     Effort: No respiratory distress.     Breath sounds: No stridor. No wheezing, rhonchi or rales.   Musculoskeletal:     Cervical back: Neck supple.   Skin:    Capillary Refill: Capillary refill takes less than 2 seconds.   Neurological:     Mental Status: She is alert.     (all labs ordered are listed, but only abnormal results are displayed) Labs Reviewed  RESP PANEL BY RT-PCR (RSV, FLU A&B, COVID)  [id number]    EKG: None  Radiology: No results found.   Procedures   Medications Ordered in the ED - No data to display                                  Medical Decision Making  Patient  with URI symptoms.  Nasal congestion.  Occasional cough.  Differential diagnose includes viral syndromes.  Less likely pneumonia.  Patient is high risk for COVID complications with her high BMI.  Would want treatment with antivirals if indicated.  Will get viral testing.   Flu COVID and RSV testing negative.  Doubt pneumonia.  Appears stable for discharge.      Final diagnoses:  Upper respiratory tract infection, unspecified type    ED Discharge Orders     None          Mozell Arias, MD 12/15/23 0830

## 2023-12-15 NOTE — ED Triage Notes (Signed)
 C/o flu like symptoms since Saturday. Intermittent fever, body aches, cough, congestion. Denies SHOB.

## 2024-03-02 ENCOUNTER — Other Ambulatory Visit

## 2024-03-10 ENCOUNTER — Ambulatory Visit: Admit: 2024-03-10 | Admitting: Obstetrics and Gynecology

## 2024-03-10 SURGERY — HYSTERECTOMY, TOTAL, LAPAROSCOPIC, ROBOT-ASSISTED WITH SALPINGECTOMY
Anesthesia: Choice | Site: Uterus

## 2024-07-04 ENCOUNTER — Ambulatory Visit: Payer: Self-pay | Admitting: Family

## 2024-07-04 VITALS — BP 98/68 | HR 78 | Temp 97.0°F | Resp 16 | Ht 66.0 in | Wt 218.6 lb

## 2024-07-04 DIAGNOSIS — Z0001 Encounter for general adult medical examination with abnormal findings: Secondary | ICD-10-CM | POA: Diagnosis not present

## 2024-07-04 DIAGNOSIS — R519 Headache, unspecified: Secondary | ICD-10-CM

## 2024-07-04 DIAGNOSIS — F332 Major depressive disorder, recurrent severe without psychotic features: Secondary | ICD-10-CM

## 2024-07-04 DIAGNOSIS — E663 Overweight: Secondary | ICD-10-CM | POA: Insufficient documentation

## 2024-07-04 DIAGNOSIS — Z0000 Encounter for general adult medical examination without abnormal findings: Secondary | ICD-10-CM

## 2024-07-04 LAB — COMPREHENSIVE METABOLIC PANEL WITH GFR
ALT: 14 U/L (ref 3–35)
AST: 15 U/L (ref 5–37)
Albumin: 4 g/dL (ref 3.5–5.2)
Alkaline Phosphatase: 54 U/L (ref 39–117)
BUN: 13 mg/dL (ref 6–23)
CO2: 29 meq/L (ref 19–32)
Calcium: 9.3 mg/dL (ref 8.4–10.5)
Chloride: 104 meq/L (ref 96–112)
Creatinine, Ser: 0.64 mg/dL (ref 0.40–1.20)
GFR: 112.52 mL/min
Glucose, Bld: 82 mg/dL (ref 70–99)
Potassium: 3.7 meq/L (ref 3.5–5.1)
Sodium: 139 meq/L (ref 135–145)
Total Bilirubin: 0.2 mg/dL (ref 0.2–1.2)
Total Protein: 7.1 g/dL (ref 6.0–8.3)

## 2024-07-04 LAB — CBC WITH DIFFERENTIAL/PLATELET
Basophils Absolute: 0 K/uL (ref 0.0–0.1)
Basophils Relative: 0.4 % (ref 0.0–3.0)
Eosinophils Absolute: 0.1 K/uL (ref 0.0–0.7)
Eosinophils Relative: 1.9 % (ref 0.0–5.0)
HCT: 34.1 % — ABNORMAL LOW (ref 36.0–46.0)
Hemoglobin: 11 g/dL — ABNORMAL LOW (ref 12.0–15.0)
Lymphocytes Relative: 33 % (ref 12.0–46.0)
Lymphs Abs: 2 K/uL (ref 0.7–4.0)
MCHC: 32.2 g/dL (ref 30.0–36.0)
MCV: 76.6 fl — ABNORMAL LOW (ref 78.0–100.0)
Monocytes Absolute: 0.5 K/uL (ref 0.1–1.0)
Monocytes Relative: 8.8 % (ref 3.0–12.0)
Neutro Abs: 3.3 K/uL (ref 1.4–7.7)
Neutrophils Relative %: 55.9 % (ref 43.0–77.0)
Platelets: 337 K/uL (ref 150.0–400.0)
RBC: 4.45 Mil/uL (ref 3.87–5.11)
RDW: 19.1 % — ABNORMAL HIGH (ref 11.5–15.5)
WBC: 6 K/uL (ref 4.0–10.5)

## 2024-07-04 LAB — TSH: TSH: 3.93 u[IU]/mL (ref 0.35–5.50)

## 2024-07-04 MED ORDER — ESCITALOPRAM OXALATE 10 MG PO TABS: 10.0000 mg | ORAL_TABLET | Freq: Every day | ORAL | 2 refills | Status: AC

## 2024-07-04 NOTE — Patient Instructions (Addendum)
 It was very nice to see you today!  I will review your lab results via MyChart in a few days. I have sent in your refill of Lexapro . Schedule a 2 month follow up visit to check how you are doing back on the medicine, see if any dose changes needed. I will complete your FMLA paperwork when we receive it.  Happy new year!        PLEASE NOTE:  If you had any lab tests please let us  know if you have not heard back within a few days. You may see your results on MyChart before we have a chance to review them but we will give you a call once they are reviewed by us . If we ordered any referrals today, please let us  know if you have not heard from their office within the next week.

## 2024-07-04 NOTE — Progress Notes (Signed)
 "  Phone [phone]  Subjective:   Patient is a 39 y.o. female presenting for annual physical.    Chief Complaint  Patient presents with   FMLA    Would like FLMA papers completed. Not in office thus can MyChart papers.    Annual Exam    not fasting  Discussed the use of AI scribe software for clinical note transcription with the patient, who gave verbal consent to proceed.  History of Present Illness Sherri Williams is a 39 year old female who presents for an annual physical exam and FMLA paperwork renewal for her depression.  She has depression and anxiety and previously took Lexapro  10 mg, which she stopped after it ran out, and she is unsure if it helped or worsened symptoms. The past year included prolonged menstrual bleeding for four months, her partner's incarceration, loss of housing, and her father's death, with significant worsening of stress and depression. She has severe, random headaches that occur on waking or during the day and she associates them with job-related & family stress. She takes Aleve one to two pills twice daily for relief. Her weight peaked at 254 lb in May 2025. She then lost weight during a period of stress and depression and now aims to maintain a healthier weight with diet changes and walking, though her exercise is inconsistent. She drinks minimal alcohol, occasionally uses THC-A products, and does not smoke or vape. She stopped therapy around June or July 2025 but is open to resuming given recent stressors and mood symptoms.  See problem oriented charting- ROS- full  review of systems was completed and negative except for what is noted in HPI above.  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    Trichomonas infection 06/10/2021   Patient Active Problem List   Diagnosis Date Noted   Overweight 07/04/2024   Obesity (BMI 35.0-39.9 without comorbidity) 07/03/2023   Acne nodule 06/06/2021   Cigarette nicotine dependence without complication 06/06/2021   Encounters for administrative purposes 09/23/2017   Depression 08/15/2011   Elevated liver function tests 08/15/2011   Past Surgical History:  Procedure Laterality Date   CESAREAN SECTION  2011   LTCS  2-layer closure   TONSILLECTOMY      Family History  Problem Relation Age of Onset   Sickle cell trait Mother    Hypertension Maternal Grandmother    Diabetes Maternal Grandmother    Sickle cell trait Brother     Medications- reviewed and updated Current Outpatient Medications  Medication Sig Dispense Refill   cetirizine (ZYRTEC) 10 MG tablet Take 10 mg by mouth daily.     medroxyPROGESTERone (PROVERA) 10 MG tablet Take 10 mg by mouth daily.     escitalopram  (LEXAPRO ) 10 MG tablet Take 1 tablet (10 mg total) by mouth daily. 30 tablet 2   No current facility-administered medications for this visit.   Allergies-reviewed and updated Allergies  Allergen Reactions   Amoxicillin Other (See Comments) and Hives    Unsure - mother's throat closes up  Has patient had a PCN reaction causing immediate rash, facial/tongue/throat swelling, SOB or lightheadedness with hypotension: unknown  Has patient had a PCN reaction causing severe rash involving mucus membranes or skin necrosis: No  Has patient had a PCN  reaction that required hospitalization Yes  Has patient had a PCN reaction occurring within the last 10 years: No  If all of the above answers are NO, then may proceed with Cephalosporin use.  Unsure - mother's throat closes up Has patient had a PCN reaction causing immediate rash, facial/tongue/throat swelling, SOB or lightheadedness with hypotension: unknown Has patient had a PCN reaction causing severe rash involving  mucus membranes or skin necrosis: No Has patient had a PCN reaction that required hospitalization Yes Has patient had a PCN reaction occurring within the last 10 years: No If all of the above answers are NO, then may proceed with Cephalosporin use.    Unsure - mother's throat closes up Has patient had a PCN reaction causing immediate rash, facial/tongue/throat swelling, SOB or lightheadedness with hypotension: unknown Has patient had a PCN reaction causing severe rash involving mucus membranes or skin necrosis: No Has patient had a PCN reaction that required hospitalization Yes Has patient had a PCN reaction occurring within the last 10 years: No If all of the above answers are NO, then may proceed with Cephalosporin use.    Social History   Social History Narrative   Not on file    Objective:  BP 98/68   Pulse 78   Temp (!) 97 F (36.1 C) (Temporal)   Resp 16   Ht 5' 6 (1.676 m)   Wt 218 lb 9.6 oz (99.2 kg)   SpO2 98%   BMI 35.28 kg/m  Physical Exam Vitals and nursing note reviewed.  Constitutional:      Appearance: Normal appearance.  HENT:     Head: Normocephalic.     Right Ear: Tympanic membrane normal.     Left Ear: Tympanic membrane normal.     Nose: Nose normal.     Mouth/Throat:     Mouth: Mucous membranes are moist.  Eyes:     Pupils: Pupils are equal, round, and reactive to light.  Cardiovascular:     Rate and Rhythm: Normal rate and regular rhythm.  Pulmonary:     Effort: Pulmonary effort is normal.     Breath sounds: Normal breath sounds.  Musculoskeletal:        General: Normal range of motion.     Cervical back: Normal range of motion.  Lymphadenopathy:     Cervical: No cervical adenopathy.  Skin:    General: Skin is warm and dry.  Neurological:     Mental Status: She is alert.  Psychiatric:        Mood and Affect: Mood normal.        Behavior: Behavior normal.     Assessment and Plan   Health Maintenance counseling: 1. Anticipatory  guidance: Patient counseled regarding regular dental exams q6 months, eye exams,  avoiding smoking and second hand smoke, limiting alcohol to 1 beverage per day, no illicit drugs.   2. Risk factor reduction:  Advised patient of need for regular exercise and diet rich with fruits and vegetables to reduce risk of heart attack and stroke. Wt Readings from Last 3 Encounters:  07/04/24 218 lb 9.6 oz (99.2 kg)  10/30/23 240 lb (108.9 kg)  07/03/23 232 lb 9.6 oz (105.5 kg)   3. Immunizations/screenings/ancillary studies Immunization History  Administered Date(s) Administered   Influenza,inj,Quad PF,6+ Mos 06/06/2021   PFIZER(Purple Top)SARS-COV-2 Vaccination 04/05/2020, 04/26/2020   PPD Test 11/12/2022   Tdap 08/16/2011   There are no preventive care reminders to display for this patient.   4. Cervical cancer  screening: done 2024, normal 5. Skin cancer screening- advised regular sunscreen use. Denies worrisome, changing, or new skin lesions.  6. Birth control/STD check: None, will do today 7. Smoking associated screening: non- smoker; takes CBD oil.  8. Alcohol screening: 2 drinks/month 9. 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 Discussed restarting Lexapro  with potential initial symptom worsening, improving after 4-6 weeks. Emphasized stress management and healthy eating to mitigate weight gain. - Restart Lexapro  10 mg daily. - Monitor symptoms, consider increasing dose to 15 mg or 20 mg if needed. - Encouraged stress management techniques: exercise, yoga, meditation. - Advised on healthy eating: focus on protein, vegetables; avoid white carbs, sweets. - Scheduled follow-up in 2 months, option for virtual visit.  Headaches Intermittent, likely stress-related headaches. Managed with Aleve,  discussed Excedrin Migraine as alternative. Restarting Lexapro  to help with anxiety that may be contributing. - Continue generic Aleve,1-2tabs bid as needed. - Consider generic OTC Excedrin Migraine if needed. - Encouraged stress management to reduce headache frequency. - F/U prn  General Health Maintenance Pap smear up to date. No birth control. Minimal alcohol. Exercise routine could improve. Discussed weight management and dental care for broken tooth. - Order CBC w/diff, CMP, & TSH labs today, lipids all wnl last year. - Encouraged regular exercise: walking, potential gym workouts. - Advised on balanced diet: adequate protein, vegetables. - Recommended dental evaluation for broken tooth. - Encouraged hydration: 2.5 to 3 liters of water daily.  Recommended follow up:  Return in about 2 months (around 09/01/2024) for depression, med refills, virtual ok. No future appointments.  Lab/Order associations: not-fasting    Lucius Krabbe, NP   "

## 2024-07-05 ENCOUNTER — Encounter: Payer: Self-pay | Admitting: Family

## 2024-07-06 ENCOUNTER — Ambulatory Visit: Payer: Self-pay | Admitting: Family

## 2024-07-15 NOTE — Telephone Encounter (Signed)
 form signed, placed on your desk.

## 2024-09-01 ENCOUNTER — Ambulatory Visit: Admitting: Family

## 2025-07-06 ENCOUNTER — Encounter: Admitting: Family
